# Patient Record
Sex: Female | Born: 1940 | Race: White | Hispanic: No | Smoking: Former smoker
Health system: Southern US, Community
[De-identification: ages and names within clinical notes are randomized; demographics above are authoritative.]

## PROBLEM LIST (undated history)

## (undated) DIAGNOSIS — E119 Type 2 diabetes mellitus without complications: Secondary | ICD-10-CM

## (undated) DIAGNOSIS — E538 Deficiency of other specified B group vitamins: Secondary | ICD-10-CM

## (undated) DIAGNOSIS — I10 Essential (primary) hypertension: Secondary | ICD-10-CM

## (undated) DIAGNOSIS — E079 Disorder of thyroid, unspecified: Secondary | ICD-10-CM

## (undated) DIAGNOSIS — E7800 Pure hypercholesterolemia, unspecified: Secondary | ICD-10-CM

## (undated) DIAGNOSIS — F0390 Unspecified dementia without behavioral disturbance: Secondary | ICD-10-CM

## (undated) DIAGNOSIS — K5900 Constipation, unspecified: Secondary | ICD-10-CM

## (undated) HISTORY — PX: ABDOMINAL HYSTERECTOMY: SHX81

---

## 2006-10-04 DIAGNOSIS — K219 Gastro-esophageal reflux disease without esophagitis: Secondary | ICD-10-CM | POA: Diagnosis present

## 2010-04-27 DIAGNOSIS — I10 Essential (primary) hypertension: Secondary | ICD-10-CM | POA: Diagnosis present

## 2010-06-17 DIAGNOSIS — E039 Hypothyroidism, unspecified: Secondary | ICD-10-CM | POA: Diagnosis present

## 2010-06-17 DIAGNOSIS — E119 Type 2 diabetes mellitus without complications: Secondary | ICD-10-CM

## 2020-04-20 DIAGNOSIS — I739 Peripheral vascular disease, unspecified: Secondary | ICD-10-CM | POA: Diagnosis not present

## 2020-04-20 DIAGNOSIS — Q845 Enlarged and hypertrophic nails: Secondary | ICD-10-CM | POA: Diagnosis not present

## 2020-04-21 DIAGNOSIS — E039 Hypothyroidism, unspecified: Secondary | ICD-10-CM | POA: Diagnosis not present

## 2020-04-21 DIAGNOSIS — E119 Type 2 diabetes mellitus without complications: Secondary | ICD-10-CM | POA: Diagnosis not present

## 2020-05-03 DIAGNOSIS — E785 Hyperlipidemia, unspecified: Secondary | ICD-10-CM | POA: Diagnosis not present

## 2020-05-03 DIAGNOSIS — Z634 Disappearance and death of family member: Secondary | ICD-10-CM | POA: Diagnosis not present

## 2020-05-03 DIAGNOSIS — E039 Hypothyroidism, unspecified: Secondary | ICD-10-CM | POA: Diagnosis not present

## 2020-05-03 DIAGNOSIS — Z79899 Other long term (current) drug therapy: Secondary | ICD-10-CM | POA: Diagnosis not present

## 2020-05-03 DIAGNOSIS — Z87891 Personal history of nicotine dependence: Secondary | ICD-10-CM | POA: Diagnosis not present

## 2020-05-03 DIAGNOSIS — E119 Type 2 diabetes mellitus without complications: Secondary | ICD-10-CM | POA: Diagnosis not present

## 2020-05-03 DIAGNOSIS — G301 Alzheimer's disease with late onset: Secondary | ICD-10-CM | POA: Diagnosis not present

## 2020-05-03 DIAGNOSIS — I10 Essential (primary) hypertension: Secondary | ICD-10-CM | POA: Diagnosis not present

## 2020-05-03 DIAGNOSIS — Z7984 Long term (current) use of oral hypoglycemic drugs: Secondary | ICD-10-CM | POA: Diagnosis not present

## 2020-05-03 DIAGNOSIS — R2689 Other abnormalities of gait and mobility: Secondary | ICD-10-CM | POA: Diagnosis not present

## 2020-05-11 DIAGNOSIS — R488 Other symbolic dysfunctions: Secondary | ICD-10-CM | POA: Diagnosis not present

## 2020-05-11 DIAGNOSIS — R2681 Unsteadiness on feet: Secondary | ICD-10-CM | POA: Diagnosis not present

## 2020-05-11 DIAGNOSIS — M6281 Muscle weakness (generalized): Secondary | ICD-10-CM | POA: Diagnosis not present

## 2020-05-11 DIAGNOSIS — R278 Other lack of coordination: Secondary | ICD-10-CM | POA: Diagnosis not present

## 2020-05-12 DIAGNOSIS — R278 Other lack of coordination: Secondary | ICD-10-CM | POA: Diagnosis not present

## 2020-05-12 DIAGNOSIS — R2681 Unsteadiness on feet: Secondary | ICD-10-CM | POA: Diagnosis not present

## 2020-05-12 DIAGNOSIS — M6281 Muscle weakness (generalized): Secondary | ICD-10-CM | POA: Diagnosis not present

## 2020-05-16 DIAGNOSIS — R2681 Unsteadiness on feet: Secondary | ICD-10-CM | POA: Diagnosis not present

## 2020-05-16 DIAGNOSIS — R278 Other lack of coordination: Secondary | ICD-10-CM | POA: Diagnosis not present

## 2020-05-16 DIAGNOSIS — M6281 Muscle weakness (generalized): Secondary | ICD-10-CM | POA: Diagnosis not present

## 2020-05-17 DIAGNOSIS — R488 Other symbolic dysfunctions: Secondary | ICD-10-CM | POA: Diagnosis not present

## 2020-05-17 DIAGNOSIS — R2681 Unsteadiness on feet: Secondary | ICD-10-CM | POA: Diagnosis not present

## 2020-05-18 DIAGNOSIS — G309 Alzheimer's disease, unspecified: Secondary | ICD-10-CM | POA: Diagnosis not present

## 2020-05-18 DIAGNOSIS — R278 Other lack of coordination: Secondary | ICD-10-CM | POA: Diagnosis not present

## 2020-05-18 DIAGNOSIS — E1169 Type 2 diabetes mellitus with other specified complication: Secondary | ICD-10-CM | POA: Diagnosis not present

## 2020-05-18 DIAGNOSIS — R2681 Unsteadiness on feet: Secondary | ICD-10-CM | POA: Diagnosis not present

## 2020-05-18 DIAGNOSIS — E785 Hyperlipidemia, unspecified: Secondary | ICD-10-CM | POA: Diagnosis not present

## 2020-05-18 DIAGNOSIS — R488 Other symbolic dysfunctions: Secondary | ICD-10-CM | POA: Diagnosis not present

## 2020-05-18 DIAGNOSIS — M6281 Muscle weakness (generalized): Secondary | ICD-10-CM | POA: Diagnosis not present

## 2020-05-23 DIAGNOSIS — R278 Other lack of coordination: Secondary | ICD-10-CM | POA: Diagnosis not present

## 2020-05-23 DIAGNOSIS — M6281 Muscle weakness (generalized): Secondary | ICD-10-CM | POA: Diagnosis not present

## 2020-05-23 DIAGNOSIS — R2681 Unsteadiness on feet: Secondary | ICD-10-CM | POA: Diagnosis not present

## 2020-05-24 DIAGNOSIS — R2681 Unsteadiness on feet: Secondary | ICD-10-CM | POA: Diagnosis not present

## 2020-05-24 DIAGNOSIS — R488 Other symbolic dysfunctions: Secondary | ICD-10-CM | POA: Diagnosis not present

## 2020-05-25 DIAGNOSIS — M6281 Muscle weakness (generalized): Secondary | ICD-10-CM | POA: Diagnosis not present

## 2020-05-25 DIAGNOSIS — R278 Other lack of coordination: Secondary | ICD-10-CM | POA: Diagnosis not present

## 2020-05-25 DIAGNOSIS — R488 Other symbolic dysfunctions: Secondary | ICD-10-CM | POA: Diagnosis not present

## 2020-05-25 DIAGNOSIS — R2681 Unsteadiness on feet: Secondary | ICD-10-CM | POA: Diagnosis not present

## 2020-05-27 DIAGNOSIS — M6281 Muscle weakness (generalized): Secondary | ICD-10-CM | POA: Diagnosis not present

## 2020-05-27 DIAGNOSIS — R278 Other lack of coordination: Secondary | ICD-10-CM | POA: Diagnosis not present

## 2020-05-27 DIAGNOSIS — R2681 Unsteadiness on feet: Secondary | ICD-10-CM | POA: Diagnosis not present

## 2020-05-30 DIAGNOSIS — M6281 Muscle weakness (generalized): Secondary | ICD-10-CM | POA: Diagnosis not present

## 2020-05-30 DIAGNOSIS — R2681 Unsteadiness on feet: Secondary | ICD-10-CM | POA: Diagnosis not present

## 2020-05-30 DIAGNOSIS — R278 Other lack of coordination: Secondary | ICD-10-CM | POA: Diagnosis not present

## 2020-05-31 DIAGNOSIS — M6281 Muscle weakness (generalized): Secondary | ICD-10-CM | POA: Diagnosis not present

## 2020-05-31 DIAGNOSIS — R2681 Unsteadiness on feet: Secondary | ICD-10-CM | POA: Diagnosis not present

## 2020-05-31 DIAGNOSIS — R278 Other lack of coordination: Secondary | ICD-10-CM | POA: Diagnosis not present

## 2020-06-02 DIAGNOSIS — M6281 Muscle weakness (generalized): Secondary | ICD-10-CM | POA: Diagnosis not present

## 2020-06-02 DIAGNOSIS — R278 Other lack of coordination: Secondary | ICD-10-CM | POA: Diagnosis not present

## 2020-06-02 DIAGNOSIS — R2681 Unsteadiness on feet: Secondary | ICD-10-CM | POA: Diagnosis not present

## 2020-06-03 DIAGNOSIS — R2681 Unsteadiness on feet: Secondary | ICD-10-CM | POA: Diagnosis not present

## 2020-06-03 DIAGNOSIS — R488 Other symbolic dysfunctions: Secondary | ICD-10-CM | POA: Diagnosis not present

## 2020-06-08 DIAGNOSIS — R488 Other symbolic dysfunctions: Secondary | ICD-10-CM | POA: Diagnosis not present

## 2020-06-08 DIAGNOSIS — R2681 Unsteadiness on feet: Secondary | ICD-10-CM | POA: Diagnosis not present

## 2020-06-09 DIAGNOSIS — R2681 Unsteadiness on feet: Secondary | ICD-10-CM | POA: Diagnosis not present

## 2020-06-09 DIAGNOSIS — R488 Other symbolic dysfunctions: Secondary | ICD-10-CM | POA: Diagnosis not present

## 2020-06-09 DIAGNOSIS — M6281 Muscle weakness (generalized): Secondary | ICD-10-CM | POA: Diagnosis not present

## 2020-06-09 DIAGNOSIS — R278 Other lack of coordination: Secondary | ICD-10-CM | POA: Diagnosis not present

## 2020-06-10 DIAGNOSIS — R278 Other lack of coordination: Secondary | ICD-10-CM | POA: Diagnosis not present

## 2020-06-10 DIAGNOSIS — M6281 Muscle weakness (generalized): Secondary | ICD-10-CM | POA: Diagnosis not present

## 2020-06-10 DIAGNOSIS — R2681 Unsteadiness on feet: Secondary | ICD-10-CM | POA: Diagnosis not present

## 2020-06-10 DIAGNOSIS — R488 Other symbolic dysfunctions: Secondary | ICD-10-CM | POA: Diagnosis not present

## 2020-06-14 DIAGNOSIS — M6281 Muscle weakness (generalized): Secondary | ICD-10-CM | POA: Diagnosis not present

## 2020-06-14 DIAGNOSIS — R488 Other symbolic dysfunctions: Secondary | ICD-10-CM | POA: Diagnosis not present

## 2020-06-14 DIAGNOSIS — R2681 Unsteadiness on feet: Secondary | ICD-10-CM | POA: Diagnosis not present

## 2020-06-14 DIAGNOSIS — R278 Other lack of coordination: Secondary | ICD-10-CM | POA: Diagnosis not present

## 2020-06-16 DIAGNOSIS — R278 Other lack of coordination: Secondary | ICD-10-CM | POA: Diagnosis not present

## 2020-06-16 DIAGNOSIS — M6281 Muscle weakness (generalized): Secondary | ICD-10-CM | POA: Diagnosis not present

## 2020-06-16 DIAGNOSIS — R2681 Unsteadiness on feet: Secondary | ICD-10-CM | POA: Diagnosis not present

## 2020-06-17 DIAGNOSIS — M6281 Muscle weakness (generalized): Secondary | ICD-10-CM | POA: Diagnosis not present

## 2020-06-17 DIAGNOSIS — R2681 Unsteadiness on feet: Secondary | ICD-10-CM | POA: Diagnosis not present

## 2020-06-17 DIAGNOSIS — R278 Other lack of coordination: Secondary | ICD-10-CM | POA: Diagnosis not present

## 2020-06-20 DIAGNOSIS — R2681 Unsteadiness on feet: Secondary | ICD-10-CM | POA: Diagnosis not present

## 2020-06-20 DIAGNOSIS — R278 Other lack of coordination: Secondary | ICD-10-CM | POA: Diagnosis not present

## 2020-06-20 DIAGNOSIS — M6281 Muscle weakness (generalized): Secondary | ICD-10-CM | POA: Diagnosis not present

## 2020-06-22 DIAGNOSIS — R2681 Unsteadiness on feet: Secondary | ICD-10-CM | POA: Diagnosis not present

## 2020-06-22 DIAGNOSIS — R278 Other lack of coordination: Secondary | ICD-10-CM | POA: Diagnosis not present

## 2020-06-22 DIAGNOSIS — M6281 Muscle weakness (generalized): Secondary | ICD-10-CM | POA: Diagnosis not present

## 2020-06-24 DIAGNOSIS — R278 Other lack of coordination: Secondary | ICD-10-CM | POA: Diagnosis not present

## 2020-06-24 DIAGNOSIS — M6281 Muscle weakness (generalized): Secondary | ICD-10-CM | POA: Diagnosis not present

## 2020-06-24 DIAGNOSIS — R2681 Unsteadiness on feet: Secondary | ICD-10-CM | POA: Diagnosis not present

## 2020-06-27 DIAGNOSIS — M6281 Muscle weakness (generalized): Secondary | ICD-10-CM | POA: Diagnosis not present

## 2020-06-27 DIAGNOSIS — R278 Other lack of coordination: Secondary | ICD-10-CM | POA: Diagnosis not present

## 2020-06-27 DIAGNOSIS — R2681 Unsteadiness on feet: Secondary | ICD-10-CM | POA: Diagnosis not present

## 2020-06-28 DIAGNOSIS — R2681 Unsteadiness on feet: Secondary | ICD-10-CM | POA: Diagnosis not present

## 2020-06-28 DIAGNOSIS — R278 Other lack of coordination: Secondary | ICD-10-CM | POA: Diagnosis not present

## 2020-06-28 DIAGNOSIS — M6281 Muscle weakness (generalized): Secondary | ICD-10-CM | POA: Diagnosis not present

## 2020-06-30 DIAGNOSIS — R278 Other lack of coordination: Secondary | ICD-10-CM | POA: Diagnosis not present

## 2020-06-30 DIAGNOSIS — R2681 Unsteadiness on feet: Secondary | ICD-10-CM | POA: Diagnosis not present

## 2020-06-30 DIAGNOSIS — M6281 Muscle weakness (generalized): Secondary | ICD-10-CM | POA: Diagnosis not present

## 2020-07-06 DIAGNOSIS — E785 Hyperlipidemia, unspecified: Secondary | ICD-10-CM | POA: Diagnosis not present

## 2020-07-06 DIAGNOSIS — G309 Alzheimer's disease, unspecified: Secondary | ICD-10-CM | POA: Diagnosis not present

## 2020-07-06 DIAGNOSIS — E1169 Type 2 diabetes mellitus with other specified complication: Secondary | ICD-10-CM | POA: Diagnosis not present

## 2020-08-04 DIAGNOSIS — E039 Hypothyroidism, unspecified: Secondary | ICD-10-CM | POA: Diagnosis not present

## 2020-08-04 DIAGNOSIS — I10 Essential (primary) hypertension: Secondary | ICD-10-CM | POA: Diagnosis not present

## 2020-08-04 DIAGNOSIS — E119 Type 2 diabetes mellitus without complications: Secondary | ICD-10-CM | POA: Diagnosis not present

## 2020-08-04 DIAGNOSIS — E785 Hyperlipidemia, unspecified: Secondary | ICD-10-CM | POA: Diagnosis not present

## 2020-09-14 DIAGNOSIS — G309 Alzheimer's disease, unspecified: Secondary | ICD-10-CM | POA: Diagnosis not present

## 2020-09-14 DIAGNOSIS — E039 Hypothyroidism, unspecified: Secondary | ICD-10-CM | POA: Diagnosis not present

## 2020-10-19 DIAGNOSIS — E1159 Type 2 diabetes mellitus with other circulatory complications: Secondary | ICD-10-CM | POA: Diagnosis not present

## 2020-10-19 DIAGNOSIS — I152 Hypertension secondary to endocrine disorders: Secondary | ICD-10-CM | POA: Diagnosis not present

## 2020-10-19 DIAGNOSIS — G309 Alzheimer's disease, unspecified: Secondary | ICD-10-CM | POA: Diagnosis not present

## 2020-11-01 DIAGNOSIS — G309 Alzheimer's disease, unspecified: Secondary | ICD-10-CM | POA: Diagnosis not present

## 2020-11-01 DIAGNOSIS — Z79899 Other long term (current) drug therapy: Secondary | ICD-10-CM | POA: Diagnosis not present

## 2020-11-01 DIAGNOSIS — G301 Alzheimer's disease with late onset: Secondary | ICD-10-CM | POA: Diagnosis not present

## 2020-11-16 DIAGNOSIS — E119 Type 2 diabetes mellitus without complications: Secondary | ICD-10-CM | POA: Diagnosis not present

## 2020-11-16 DIAGNOSIS — G309 Alzheimer's disease, unspecified: Secondary | ICD-10-CM | POA: Diagnosis not present

## 2020-11-16 DIAGNOSIS — I10 Essential (primary) hypertension: Secondary | ICD-10-CM | POA: Diagnosis not present

## 2020-11-16 DIAGNOSIS — E039 Hypothyroidism, unspecified: Secondary | ICD-10-CM | POA: Diagnosis not present

## 2020-11-16 DIAGNOSIS — E785 Hyperlipidemia, unspecified: Secondary | ICD-10-CM | POA: Diagnosis not present

## 2020-11-28 DIAGNOSIS — E119 Type 2 diabetes mellitus without complications: Secondary | ICD-10-CM | POA: Diagnosis not present

## 2020-11-28 DIAGNOSIS — E785 Hyperlipidemia, unspecified: Secondary | ICD-10-CM | POA: Diagnosis not present

## 2020-11-28 DIAGNOSIS — G301 Alzheimer's disease with late onset: Secondary | ICD-10-CM | POA: Diagnosis not present

## 2020-11-28 DIAGNOSIS — Z7984 Long term (current) use of oral hypoglycemic drugs: Secondary | ICD-10-CM | POA: Diagnosis not present

## 2020-11-28 DIAGNOSIS — Z87891 Personal history of nicotine dependence: Secondary | ICD-10-CM | POA: Diagnosis not present

## 2020-11-28 DIAGNOSIS — E039 Hypothyroidism, unspecified: Secondary | ICD-10-CM | POA: Diagnosis not present

## 2020-11-28 DIAGNOSIS — I10 Essential (primary) hypertension: Secondary | ICD-10-CM | POA: Diagnosis not present

## 2020-11-28 DIAGNOSIS — F32A Depression, unspecified: Secondary | ICD-10-CM | POA: Diagnosis not present

## 2020-11-28 DIAGNOSIS — Z79899 Other long term (current) drug therapy: Secondary | ICD-10-CM | POA: Diagnosis not present

## 2020-12-28 DIAGNOSIS — G309 Alzheimer's disease, unspecified: Secondary | ICD-10-CM | POA: Diagnosis not present

## 2020-12-28 DIAGNOSIS — I10 Essential (primary) hypertension: Secondary | ICD-10-CM | POA: Diagnosis not present

## 2021-01-11 DIAGNOSIS — G309 Alzheimer's disease, unspecified: Secondary | ICD-10-CM | POA: Diagnosis not present

## 2021-01-11 DIAGNOSIS — E039 Hypothyroidism, unspecified: Secondary | ICD-10-CM | POA: Diagnosis not present

## 2021-01-11 DIAGNOSIS — E785 Hyperlipidemia, unspecified: Secondary | ICD-10-CM | POA: Diagnosis not present

## 2021-01-11 DIAGNOSIS — E119 Type 2 diabetes mellitus without complications: Secondary | ICD-10-CM | POA: Diagnosis not present

## 2021-01-11 DIAGNOSIS — I10 Essential (primary) hypertension: Secondary | ICD-10-CM | POA: Diagnosis not present

## 2021-01-23 DIAGNOSIS — E1159 Type 2 diabetes mellitus with other circulatory complications: Secondary | ICD-10-CM | POA: Diagnosis not present

## 2021-01-23 DIAGNOSIS — I7091 Generalized atherosclerosis: Secondary | ICD-10-CM | POA: Diagnosis not present

## 2021-01-23 DIAGNOSIS — B351 Tinea unguium: Secondary | ICD-10-CM | POA: Diagnosis not present

## 2021-01-23 DIAGNOSIS — R600 Localized edema: Secondary | ICD-10-CM | POA: Diagnosis not present

## 2021-07-28 DIAGNOSIS — E119 Type 2 diabetes mellitus without complications: Secondary | ICD-10-CM | POA: Diagnosis not present

## 2021-07-28 DIAGNOSIS — I10 Essential (primary) hypertension: Secondary | ICD-10-CM | POA: Diagnosis not present

## 2021-07-28 DIAGNOSIS — E785 Hyperlipidemia, unspecified: Secondary | ICD-10-CM | POA: Diagnosis not present

## 2021-07-28 DIAGNOSIS — E039 Hypothyroidism, unspecified: Secondary | ICD-10-CM | POA: Diagnosis not present

## 2021-08-09 DIAGNOSIS — E038 Other specified hypothyroidism: Secondary | ICD-10-CM | POA: Diagnosis not present

## 2021-08-09 DIAGNOSIS — R197 Diarrhea, unspecified: Secondary | ICD-10-CM | POA: Diagnosis not present

## 2021-08-09 DIAGNOSIS — E782 Mixed hyperlipidemia: Secondary | ICD-10-CM | POA: Diagnosis not present

## 2021-08-15 ENCOUNTER — Inpatient Hospital Stay (HOSPITAL_BASED_OUTPATIENT_CLINIC_OR_DEPARTMENT_OTHER)
Admission: EM | Admit: 2021-08-15 | Discharge: 2021-08-22 | DRG: 522 | Disposition: A | Discharge status: Skilled Nursing Facility | Payer: Medicare Other | Source: Skilled Nursing Facility | Attending: Internal Medicine | Admitting: Internal Medicine

## 2021-08-15 ENCOUNTER — Other Ambulatory Visit: Payer: Self-pay

## 2021-08-15 ENCOUNTER — Encounter (HOSPITAL_BASED_OUTPATIENT_CLINIC_OR_DEPARTMENT_OTHER): Payer: Self-pay | Admitting: Obstetrics and Gynecology

## 2021-08-15 ENCOUNTER — Emergency Department (HOSPITAL_BASED_OUTPATIENT_CLINIC_OR_DEPARTMENT_OTHER): Payer: Medicare Other

## 2021-08-15 ENCOUNTER — Emergency Department (HOSPITAL_BASED_OUTPATIENT_CLINIC_OR_DEPARTMENT_OTHER): Payer: Medicare Other | Admitting: Radiology

## 2021-08-15 DIAGNOSIS — E119 Type 2 diabetes mellitus without complications: Secondary | ICD-10-CM | POA: Diagnosis not present

## 2021-08-15 DIAGNOSIS — F03918 Unspecified dementia, unspecified severity, with other behavioral disturbance: Secondary | ICD-10-CM | POA: Diagnosis not present

## 2021-08-15 DIAGNOSIS — Z20822 Contact with and (suspected) exposure to covid-19: Secondary | ICD-10-CM | POA: Diagnosis present

## 2021-08-15 DIAGNOSIS — Z87891 Personal history of nicotine dependence: Secondary | ICD-10-CM

## 2021-08-15 DIAGNOSIS — S72012A Unspecified intracapsular fracture of left femur, initial encounter for closed fracture: Secondary | ICD-10-CM | POA: Diagnosis not present

## 2021-08-15 DIAGNOSIS — K5900 Constipation, unspecified: Secondary | ICD-10-CM | POA: Diagnosis not present

## 2021-08-15 DIAGNOSIS — S72002A Fracture of unspecified part of neck of left femur, initial encounter for closed fracture: Principal | ICD-10-CM | POA: Diagnosis present

## 2021-08-15 DIAGNOSIS — F02C18 Dementia in other diseases classified elsewhere, severe, with other behavioral disturbance: Secondary | ICD-10-CM | POA: Diagnosis present

## 2021-08-15 DIAGNOSIS — D62 Acute posthemorrhagic anemia: Secondary | ICD-10-CM

## 2021-08-15 DIAGNOSIS — Z79890 Hormone replacement therapy (postmenopausal): Secondary | ICD-10-CM

## 2021-08-15 DIAGNOSIS — I10 Essential (primary) hypertension: Secondary | ICD-10-CM | POA: Diagnosis present

## 2021-08-15 DIAGNOSIS — Z043 Encounter for examination and observation following other accident: Secondary | ICD-10-CM | POA: Diagnosis not present

## 2021-08-15 DIAGNOSIS — Z96642 Presence of left artificial hip joint: Secondary | ICD-10-CM | POA: Diagnosis not present

## 2021-08-15 DIAGNOSIS — Z7984 Long term (current) use of oral hypoglycemic drugs: Secondary | ICD-10-CM | POA: Diagnosis not present

## 2021-08-15 DIAGNOSIS — E538 Deficiency of other specified B group vitamins: Secondary | ICD-10-CM | POA: Diagnosis present

## 2021-08-15 DIAGNOSIS — E1169 Type 2 diabetes mellitus with other specified complication: Secondary | ICD-10-CM

## 2021-08-15 DIAGNOSIS — G309 Alzheimer's disease, unspecified: Secondary | ICD-10-CM | POA: Diagnosis not present

## 2021-08-15 DIAGNOSIS — R6889 Other general symptoms and signs: Secondary | ICD-10-CM | POA: Diagnosis not present

## 2021-08-15 DIAGNOSIS — E7800 Pure hypercholesterolemia, unspecified: Secondary | ICD-10-CM | POA: Diagnosis present

## 2021-08-15 DIAGNOSIS — E039 Hypothyroidism, unspecified: Secondary | ICD-10-CM | POA: Diagnosis present

## 2021-08-15 DIAGNOSIS — Z79899 Other long term (current) drug therapy: Secondary | ICD-10-CM

## 2021-08-15 DIAGNOSIS — M25552 Pain in left hip: Secondary | ICD-10-CM | POA: Diagnosis not present

## 2021-08-15 DIAGNOSIS — R9431 Abnormal electrocardiogram [ECG] [EKG]: Secondary | ICD-10-CM | POA: Diagnosis not present

## 2021-08-15 DIAGNOSIS — W19XXXA Unspecified fall, initial encounter: Secondary | ICD-10-CM | POA: Diagnosis not present

## 2021-08-15 DIAGNOSIS — W1830XA Fall on same level, unspecified, initial encounter: Secondary | ICD-10-CM | POA: Diagnosis present

## 2021-08-15 DIAGNOSIS — Z66 Do not resuscitate: Secondary | ICD-10-CM | POA: Diagnosis not present

## 2021-08-15 DIAGNOSIS — F0390 Unspecified dementia without behavioral disturbance: Secondary | ICD-10-CM | POA: Insufficient documentation

## 2021-08-15 DIAGNOSIS — Z743 Need for continuous supervision: Secondary | ICD-10-CM | POA: Diagnosis not present

## 2021-08-15 DIAGNOSIS — Z90710 Acquired absence of both cervix and uterus: Secondary | ICD-10-CM

## 2021-08-15 HISTORY — DX: Constipation, unspecified: K59.00

## 2021-08-15 HISTORY — DX: Disorder of thyroid, unspecified: E07.9

## 2021-08-15 HISTORY — DX: Pure hypercholesterolemia, unspecified: E78.00

## 2021-08-15 HISTORY — DX: Deficiency of other specified B group vitamins: E53.8

## 2021-08-15 HISTORY — DX: Type 2 diabetes mellitus without complications: E11.9

## 2021-08-15 HISTORY — DX: Essential (primary) hypertension: I10

## 2021-08-15 HISTORY — DX: Unspecified dementia, unspecified severity, without behavioral disturbance, psychotic disturbance, mood disturbance, and anxiety: F03.90

## 2021-08-15 LAB — CBC WITH DIFFERENTIAL/PLATELET
Abs Immature Granulocytes: 0.04 10*3/uL (ref 0.00–0.07)
Basophils Absolute: 0 10*3/uL (ref 0.0–0.1)
Basophils Relative: 0 %
Eosinophils Absolute: 0.1 10*3/uL (ref 0.0–0.5)
Eosinophils Relative: 1 %
HCT: 36.6 % (ref 36.0–46.0)
Hemoglobin: 12.4 g/dL (ref 12.0–15.0)
Immature Granulocytes: 0 %
Lymphocytes Relative: 7 %
Lymphs Abs: 0.6 10*3/uL — ABNORMAL LOW (ref 0.7–4.0)
MCH: 31.2 pg (ref 26.0–34.0)
MCHC: 33.9 g/dL (ref 30.0–36.0)
MCV: 92 fL (ref 80.0–100.0)
Monocytes Absolute: 0.9 10*3/uL (ref 0.1–1.0)
Monocytes Relative: 10 %
Neutro Abs: 7.4 10*3/uL (ref 1.7–7.7)
Neutrophils Relative %: 82 %
Platelets: 203 10*3/uL (ref 150–400)
RBC: 3.98 MIL/uL (ref 3.87–5.11)
RDW: 13.2 % (ref 11.5–15.5)
WBC: 9.1 10*3/uL (ref 4.0–10.5)
nRBC: 0 % (ref 0.0–0.2)

## 2021-08-15 LAB — BASIC METABOLIC PANEL
Anion gap: 15 (ref 5–15)
BUN: 20 mg/dL (ref 8–23)
CO2: 24 mmol/L (ref 22–32)
Calcium: 9.7 mg/dL (ref 8.9–10.3)
Chloride: 95 mmol/L — ABNORMAL LOW (ref 98–111)
Creatinine, Ser: 0.92 mg/dL (ref 0.44–1.00)
GFR, Estimated: >60 mL/min (ref >60)
Glucose, Bld: 120 mg/dL — ABNORMAL HIGH (ref 70–99)
Potassium: 4.5 mmol/L (ref 3.5–5.1)
Sodium: 134 mmol/L — ABNORMAL LOW (ref 135–145)

## 2021-08-15 MED ORDER — SODIUM CHLORIDE 0.9 % IV SOLN: Freq: Once | INTRAVENOUS | Status: AC

## 2021-08-15 MED ORDER — ACETAMINOPHEN 325 MG PO TABS: 650.0000 mg | ORAL_TABLET | Freq: Four times a day (QID) | ORAL | Status: DC | PRN

## 2021-08-15 NOTE — ED Triage Notes (Signed)
Patient presents to the ER for fall at [facility]. Patient is BIB EMS. Patient reports left hip pain. Patient is not alert or oriented. Patient has no shortening or rotation per EMS. No blood thinners. Patient's fall was unwitnessed. Had a dental procedure completed today under anesthesia.

## 2021-08-15 NOTE — Progress Notes (Signed)
Patient discussed with EDP.  Imaging reviewed.  There is a left femoral neck fracture which appears more displaced than purely valgus impacted.  Per EDP patient ambulatory without assistive devices prior to fall which resulted in this injury.  Patient not on any anticoagulation.    Recommendations: - NWB LLE - Transfer to [facility] - Admit to medical service for preoperative clearance/risk stratification/optimization - NPO after midnight for possible OR 08/16/2021 - Full consult to follow in AM  Ernestina Columbia M.D. Orthopaedic Surgery [facility]

## 2021-08-15 NOTE — ED Provider Notes (Signed)
I provided a substantive portion of the care of this patient.  I personally performed the entirety of the exam for this encounter.     Patient had been sedated for a dental procedure.  She was back home and seated in the chair and unexpectedly got up to go to the bathroom.  The patient fell landing on her left hip.  Patient does have significant dementia at baseline and had sedation for her procedure.  He has been awake but confused.  Patient is resting in the stretcher with some picking motion of the hands.  With light voice she awakens and is pleasant and interactive.  She is confused.  Bilateral breath sounds symmetric.  No pain to compression of the chest wall no crepitus.  No pain around the shoulders.  She is holding the left hip in a flexed position.  Both feet are warm and dry.  1+ pulse on the left foot 2+ pulse right foot.   Arby Barrette, MD 08/15/21 [phone]

## 2021-08-15 NOTE — ED Provider Notes (Signed)
[facility] EMERGENCY DEPT Provider Note   CSN: [id number] Arrival date & time: 08/15/21  1717     History  Chief Complaint  Patient presents with   Marletta Lor    Beth Pennington is a 81 y.o. female with history of Alzheimer's disease currently at [facility] who presents to the ED for evaluation of an unwitnessed ground-level fall that occurred earlier today.  Patient is accompanied by her son and daughter-in-law who provide most of the history.  Per son, patient had a dental procedure done today with anesthesia that was performed without complication.  She was reportedly taken back to [facility] and put to bed, but at some point, she woke up and t decided to go to the bathroom.  She was found down in the bathroom later that day, and staff is unsure for how long that she had been down after the fall before being found.  Patient states she has pain in her hip.  History otherwise difficult due to degree of Alzheimer's.  Family states that she is not on blood thinners.   Fall      Home Medications Prior to Admission medications   Not on File      Allergies    Patient has no known allergies.    Review of Systems   Review of Systems  Physical Exam Updated Vital Signs BP (!) 157/107   Pulse 95   Temp 98.4 F (36.9 C) (Oral)   Resp 18   SpO2 98%  Physical Exam Vitals and nursing note reviewed.  Constitutional:      General: She is not in acute distress.    Appearance: She is not ill-appearing.     Comments: Patient lying flat on her back, with knees up and feet flat on the ground.  Family notes this is only position comfortable for her  HENT:     Head: Atraumatic.  Eyes:     Conjunctiva/sclera: Conjunctivae normal.  Cardiovascular:     Rate and Rhythm: Normal rate and regular rhythm.     Pulses: Normal pulses.          Radial pulses are 2+ on the right side and 2+ on the left side.       Dorsalis pedis pulses are 2+ on the right side and 2+ on the left  side.     Heart sounds: No murmur heard. Pulmonary:     Effort: Pulmonary effort is normal. No respiratory distress.     Breath sounds: Normal breath sounds.  Abdominal:     General: Abdomen is flat. There is no distension.     Palpations: Abdomen is soft.     Tenderness: There is no abdominal tenderness.  Musculoskeletal:        General: Normal range of motion.     Cervical back: Normal range of motion.     Right lower leg: No edema.     Left lower leg: No edema.     Comments: No obvious rotation of the limb.  Mild tenderness near the proximal femoral head.  No obvious bruising, swelling or deformity.  Skin:    General: Skin is warm and dry.     Capillary Refill: Capillary refill takes less than 2 seconds.  Neurological:     General: No focal deficit present.     Mental Status: She is alert.  Psychiatric:        Mood and Affect: Mood normal.    ED Results / Procedures / Treatments  Labs (all labs ordered are listed, but only abnormal results are displayed) Labs Reviewed  BASIC METABOLIC PANEL - Abnormal; Notable for the following components:      Result Value   Sodium 134 (*)    Chloride 95 (*)    Glucose, Bld 120 (*)    All other components within normal limits  CBC WITH DIFFERENTIAL/PLATELET - Abnormal; Notable for the following components:   Lymphs Abs 0.6 (*)    All other components within normal limits    EKG None  Radiology DG Chest Portable 1 View  Result Date: 08/15/2021 CLINICAL DATA:  fall EXAM: PORTABLE CHEST 1 VIEW.  Patient is rotated. COMPARISON:  None Available. FINDINGS: The heart and mediastinal contours are within normal limits. No focal consolidation. No pulmonary edema. No pleural effusion. No pneumothorax. No acute osseous abnormality. IMPRESSION: No active disease. Electronically Signed   By: Tish FredericksonMorgane  Naveau M.D.   On: 08/15/2021 20:39   DG Hip Unilat W or Wo Pelvis 2-3 Views Left  Result Date: 08/15/2021 CLINICAL DATA:  Trauma, fall EXAM: DG  HIP (WITH OR WITHOUT PELVIS) 2-3V LEFT COMPARISON:  None Available. FINDINGS: There is comminuted subcapital fracture of neck of left femur. There is no dislocation. Osteopenia is seen in bony structures. IMPRESSION: There is comminuted, impacted fracture in the subcapital portion of neck of left femur. Electronically Signed   By: Ernie AvenaPalani  Rathinasamy M.D.   On: 08/15/2021 17:47    Procedures Procedures    Medications Ordered in ED Medications  0.9 %  sodium chloride infusion (has no administration in time range)  acetaminophen (TYLENOL) tablet 650 mg (has no administration in time range)    ED Course/ Medical Decision Making/ A&P                           Medical Decision Making Amount and/or Complexity of Data Reviewed Labs: ordered. Radiology: ordered.  Risk Decision regarding hospitalization.   Social determinants of health:  Social History   Socioeconomic History   Marital status: Widowed    Spouse name: Not on file   Number of children: Not on file   Years of education: Not on file   Highest education level: Not on file  Occupational History   Not on file  Tobacco Use   Smoking status: Former    Types: Cigarettes    Passive exposure: Past   Smokeless tobacco: Never  Vaping Use   Vaping Use: Never used  Substance and Sexual Activity   Alcohol use: Not Currently   Drug use: Not Currently   Sexual activity: Not Currently  Other Topics Concern   Not on file  Social History Narrative   Not on file   Social Determinants of Health   Financial Resource Strain: Not on file  Food Insecurity: Not on file  Transportation Needs: Not on file  Physical Activity: Not on file  Stress: Not on file  Social Connections: Not on file  Intimate Partner Violence: Not on file     Initial impression:  This patient presents to the ED for concern after a ground-level fall that occurred earlier today, this involves an extensive number of treatment options, and is a complaint  that carries with it a high risk of complications and morbidity.   Differentials include fracture, dislocation, compression fracture.   Comorbidities affecting care:  Alzheimer's  Additional history obtained: Son and daughter-in-law  Lab Tests  I Ordered, reviewed, and interpreted labs and EKG.  The pertinent results include:  BMP and CBC unremarkable  Imaging Studies ordered:  I ordered imaging studies including  Chest x-ray without acute findings Left hip x-ray with comminuted impacted fracture of the left femoral neck I independently visualized and interpreted imaging and I agree with the radiologist interpretation.   EKG: Sinus rhythm  Cardiac Monitoring:  The patient was maintained on a cardiac monitor.  I personally viewed and interpreted the cardiac monitored which showed an underlying rhythm of: Sinus rhythm   Medicines ordered and prescription drug management:  I ordered medication including: Normal saline maintenance at 100 mils per hour Tylenol 650 mg p.o. I have reviewed the patients home medicines and have made adjustments as needed   Consultations Obtained:  I requested consultation with orthopedic surgery spoke with Dr. Sherilyn Dacosta,  and discussed lab and imaging findings as well as pertinent plan - they recommend: Admit to medicine while he does surgical planning for surgery likely tomorrow   ED Course/Re-evaluation: 81 year old female presents to the ED for evaluation of ground-level fall that occurred earlier today.  Vitals without significant abnormality.  On exam, lying flat on her back with her hips flexed.  Neurovascularly intact.  No obvious external rotation of the hips.  Physical exam without any signs of additional injuries.  X-ray shows comminuted impacted fracture of the left femoral neck,.  Patient's family believe that patient seems slightly altered residually after her dental procedure today.  Patient herself does not appear to be in significant  pain so with hold narcotic medications in favor of Tylenol for now.  We will also administer maintenance fluids at 100 mils per hour normal saline.  Consulted with surgery and spoke with Dr. Sherilyn Dacosta who will plan patient surgery.  Dr. Antionette Char agrees to admit to medicine. My attending, Dr. Clarice Pole also personally evaluated the patient and agrees with treatment and plan.  Disposition:  After consideration of the diagnostic results, physical exam, history and the patients response to treatment feel that the patent would benefit from admission.   Left femoral neck fracture: Plan and management as described above. Discharged home in good condition.  Final Clinical Impression(s) / ED Diagnoses Final diagnoses:  Closed fracture of neck of left femur, initial encounter [facility])    Rx / DC Orders ED Discharge Orders     None         Delight Ovens 08/15/21 2152    Arby Barrette, MD 08/19/21 2321

## 2021-08-16 ENCOUNTER — Emergency Department (HOSPITAL_BASED_OUTPATIENT_CLINIC_OR_DEPARTMENT_OTHER): Payer: Medicare Other

## 2021-08-16 DIAGNOSIS — Z79890 Hormone replacement therapy (postmenopausal): Secondary | ICD-10-CM | POA: Diagnosis not present

## 2021-08-16 DIAGNOSIS — Z20822 Contact with and (suspected) exposure to covid-19: Secondary | ICD-10-CM | POA: Diagnosis present

## 2021-08-16 DIAGNOSIS — S72002A Fracture of unspecified part of neck of left femur, initial encounter for closed fracture: Secondary | ICD-10-CM | POA: Diagnosis not present

## 2021-08-16 DIAGNOSIS — Z87891 Personal history of nicotine dependence: Secondary | ICD-10-CM | POA: Diagnosis not present

## 2021-08-16 DIAGNOSIS — F0390 Unspecified dementia without behavioral disturbance: Secondary | ICD-10-CM | POA: Insufficient documentation

## 2021-08-16 DIAGNOSIS — W1830XA Fall on same level, unspecified, initial encounter: Secondary | ICD-10-CM | POA: Diagnosis present

## 2021-08-16 DIAGNOSIS — E119 Type 2 diabetes mellitus without complications: Secondary | ICD-10-CM

## 2021-08-16 DIAGNOSIS — K5900 Constipation, unspecified: Secondary | ICD-10-CM | POA: Diagnosis not present

## 2021-08-16 DIAGNOSIS — F02C18 Dementia in other diseases classified elsewhere, severe, with other behavioral disturbance: Secondary | ICD-10-CM | POA: Diagnosis present

## 2021-08-16 DIAGNOSIS — M6281 Muscle weakness (generalized): Secondary | ICD-10-CM | POA: Diagnosis not present

## 2021-08-16 DIAGNOSIS — M2550 Pain in unspecified joint: Secondary | ICD-10-CM | POA: Diagnosis not present

## 2021-08-16 DIAGNOSIS — G309 Alzheimer's disease, unspecified: Secondary | ICD-10-CM | POA: Diagnosis present

## 2021-08-16 DIAGNOSIS — E039 Hypothyroidism, unspecified: Secondary | ICD-10-CM | POA: Diagnosis present

## 2021-08-16 DIAGNOSIS — F03918 Unspecified dementia, unspecified severity, with other behavioral disturbance: Secondary | ICD-10-CM

## 2021-08-16 DIAGNOSIS — D62 Acute posthemorrhagic anemia: Secondary | ICD-10-CM | POA: Diagnosis not present

## 2021-08-16 DIAGNOSIS — R2689 Other abnormalities of gait and mobility: Secondary | ICD-10-CM | POA: Diagnosis not present

## 2021-08-16 DIAGNOSIS — E1169 Type 2 diabetes mellitus with other specified complication: Secondary | ICD-10-CM | POA: Diagnosis not present

## 2021-08-16 DIAGNOSIS — R5381 Other malaise: Secondary | ICD-10-CM | POA: Diagnosis not present

## 2021-08-16 DIAGNOSIS — Z471 Aftercare following joint replacement surgery: Secondary | ICD-10-CM | POA: Diagnosis not present

## 2021-08-16 DIAGNOSIS — Z0389 Encounter for observation for other suspected diseases and conditions ruled out: Secondary | ICD-10-CM | POA: Diagnosis not present

## 2021-08-16 DIAGNOSIS — E538 Deficiency of other specified B group vitamins: Secondary | ICD-10-CM | POA: Diagnosis present

## 2021-08-16 DIAGNOSIS — S72002D Fracture of unspecified part of neck of left femur, subsequent encounter for closed fracture with routine healing: Secondary | ICD-10-CM | POA: Diagnosis not present

## 2021-08-16 DIAGNOSIS — E7800 Pure hypercholesterolemia, unspecified: Secondary | ICD-10-CM | POA: Diagnosis present

## 2021-08-16 DIAGNOSIS — Z7984 Long term (current) use of oral hypoglycemic drugs: Secondary | ICD-10-CM | POA: Diagnosis not present

## 2021-08-16 DIAGNOSIS — W19XXXA Unspecified fall, initial encounter: Secondary | ICD-10-CM | POA: Diagnosis not present

## 2021-08-16 DIAGNOSIS — M25552 Pain in left hip: Secondary | ICD-10-CM | POA: Diagnosis not present

## 2021-08-16 DIAGNOSIS — Z96642 Presence of left artificial hip joint: Secondary | ICD-10-CM | POA: Diagnosis not present

## 2021-08-16 DIAGNOSIS — I69828 Other speech and language deficits following other cerebrovascular disease: Secondary | ICD-10-CM | POA: Diagnosis not present

## 2021-08-16 DIAGNOSIS — Z66 Do not resuscitate: Secondary | ICD-10-CM | POA: Diagnosis present

## 2021-08-16 DIAGNOSIS — Z90710 Acquired absence of both cervix and uterus: Secondary | ICD-10-CM | POA: Diagnosis not present

## 2021-08-16 DIAGNOSIS — Z9181 History of falling: Secondary | ICD-10-CM | POA: Diagnosis not present

## 2021-08-16 DIAGNOSIS — G301 Alzheimer's disease with late onset: Secondary | ICD-10-CM | POA: Diagnosis not present

## 2021-08-16 DIAGNOSIS — Z79899 Other long term (current) drug therapy: Secondary | ICD-10-CM | POA: Diagnosis not present

## 2021-08-16 DIAGNOSIS — I10 Essential (primary) hypertension: Secondary | ICD-10-CM | POA: Diagnosis not present

## 2021-08-16 DIAGNOSIS — Z7401 Bed confinement status: Secondary | ICD-10-CM | POA: Diagnosis not present

## 2021-08-16 DIAGNOSIS — R2681 Unsteadiness on feet: Secondary | ICD-10-CM | POA: Diagnosis not present

## 2021-08-16 LAB — CBC WITH DIFFERENTIAL/PLATELET
Abs Immature Granulocytes: 0.03 10*3/uL (ref 0.00–0.07)
Basophils Absolute: 0.1 10*3/uL (ref 0.0–0.1)
Basophils Relative: 1 %
Eosinophils Absolute: 0.6 10*3/uL — ABNORMAL HIGH (ref 0.0–0.5)
Eosinophils Relative: 8 %
HCT: 36.3 % (ref 36.0–46.0)
Hemoglobin: 12.2 g/dL (ref 12.0–15.0)
Immature Granulocytes: 0 %
Lymphocytes Relative: 16 %
Lymphs Abs: 1.1 10*3/uL (ref 0.7–4.0)
MCH: 32 pg (ref 26.0–34.0)
MCHC: 33.6 g/dL (ref 30.0–36.0)
MCV: 95.3 fL (ref 80.0–100.0)
Monocytes Absolute: 1.1 10*3/uL — ABNORMAL HIGH (ref 0.1–1.0)
Monocytes Relative: 15 %
Neutro Abs: 4.4 10*3/uL (ref 1.7–7.7)
Neutrophils Relative %: 60 %
Platelets: 193 10*3/uL (ref 150–400)
RBC: 3.81 MIL/uL — ABNORMAL LOW (ref 3.87–5.11)
RDW: 13.6 % (ref 11.5–15.5)
WBC: 7.3 10*3/uL (ref 4.0–10.5)
nRBC: 0 % (ref 0.0–0.2)

## 2021-08-16 LAB — BASIC METABOLIC PANEL
Anion gap: 8 (ref 5–15)
BUN: 16 mg/dL (ref 8–23)
CO2: 28 mmol/L (ref 22–32)
Calcium: 9.2 mg/dL (ref 8.9–10.3)
Chloride: 103 mmol/L (ref 98–111)
Creatinine, Ser: 1 mg/dL (ref 0.44–1.00)
GFR, Estimated: 57 mL/min — ABNORMAL LOW (ref >60)
Glucose, Bld: 113 mg/dL — ABNORMAL HIGH (ref 70–99)
Potassium: 4.4 mmol/L (ref 3.5–5.1)
Sodium: 139 mmol/L (ref 135–145)

## 2021-08-16 LAB — HEMOGLOBIN A1C
Hgb A1c MFr Bld: 6 % — ABNORMAL HIGH (ref 4.8–5.6)
Mean Plasma Glucose: 125.5 mg/dL

## 2021-08-16 LAB — SURGICAL PCR SCREEN
MRSA, PCR: NEGATIVE
Staphylococcus aureus: NEGATIVE

## 2021-08-16 LAB — CBG MONITORING, ED
Glucose-Capillary: 75 mg/dL (ref 70–99)
Glucose-Capillary: 99 mg/dL (ref 70–99)

## 2021-08-16 MED ORDER — CEFAZOLIN SODIUM-DEXTROSE 2-4 GM/100ML-% IV SOLN
2.0000 g | INTRAVENOUS | Status: AC
  Administered 2021-08-17: 2 g via INTRAVENOUS
  Filled 2021-08-16: qty 100

## 2021-08-16 MED ORDER — FLUOXETINE HCL 20 MG/5ML PO SOLN
5.0000 mg | Freq: Every day | ORAL | Status: DC
  Administered 2021-08-16 – 2021-08-22 (×6): 5 mg via ORAL
  Filled 2021-08-16 (×12): qty 5

## 2021-08-16 MED ORDER — ACETAMINOPHEN 500 MG PO TABS: 1000.0000 mg | ORAL_TABLET | Freq: Once | ORAL | Status: DC

## 2021-08-16 MED ORDER — HALOPERIDOL 0.5 MG PO TABS
0.5000 mg | ORAL_TABLET | Freq: Two times a day (BID) | ORAL | Status: DC
  Administered 2021-08-16 – 2021-08-20 (×8): 0.5 mg via ORAL
  Filled 2021-08-16 (×12): qty 1

## 2021-08-16 MED ORDER — ACETAMINOPHEN 325 MG PO TABS
650.0000 mg | ORAL_TABLET | Freq: Four times a day (QID) | ORAL | Status: DC | PRN
  Administered 2021-08-18 – 2021-08-22 (×2): 650 mg via ORAL
  Filled 2021-08-16 (×2): qty 2

## 2021-08-16 MED ORDER — OLANZAPINE 5 MG PO TABS
5.0000 mg | ORAL_TABLET | Freq: Every day | ORAL | Status: DC
  Administered 2021-08-16 – 2021-08-20 (×5): 5 mg via ORAL
  Filled 2021-08-16 (×7): qty 1

## 2021-08-16 MED ORDER — ONDANSETRON HCL 4 MG/2ML IJ SOLN
4.0000 mg | Freq: Once | INTRAMUSCULAR | Status: AC
  Administered 2021-08-16: 4 mg via INTRAVENOUS
  Filled 2021-08-16: qty 2

## 2021-08-16 MED ORDER — MORPHINE SULFATE (PF) 2 MG/ML IV SOLN
2.0000 mg | INTRAVENOUS | Status: DC | PRN
  Filled 2021-08-16: qty 1

## 2021-08-16 MED ORDER — MUPIROCIN 2 % EX OINT: 1.0000 "application " | TOPICAL_OINTMENT | Freq: Two times a day (BID) | CUTANEOUS | Status: DC

## 2021-08-16 MED ORDER — MORPHINE SULFATE (PF) 4 MG/ML IV SOLN
4.0000 mg | Freq: Once | INTRAVENOUS | Status: AC
  Administered 2021-08-16: 4 mg via INTRAVENOUS
  Filled 2021-08-16: qty 1

## 2021-08-16 MED ORDER — MEMANTINE HCL 10 MG PO TABS
5.0000 mg | ORAL_TABLET | Freq: Every day | ORAL | Status: DC
  Administered 2021-08-16 – 2021-08-21 (×5): 5 mg via ORAL
  Filled 2021-08-16 (×6): qty 1

## 2021-08-16 MED ORDER — TRAMADOL HCL 50 MG PO TABS
50.0000 mg | ORAL_TABLET | Freq: Four times a day (QID) | ORAL | Status: DC | PRN
  Administered 2021-08-18 – 2021-08-21 (×6): 50 mg via ORAL
  Filled 2021-08-16 (×6): qty 1

## 2021-08-16 MED ORDER — ONDANSETRON HCL 4 MG PO TABS: 4.0000 mg | ORAL_TABLET | Freq: Four times a day (QID) | ORAL | Status: DC | PRN

## 2021-08-16 MED ORDER — ATORVASTATIN CALCIUM 40 MG PO TABS
40.0000 mg | ORAL_TABLET | Freq: Every day | ORAL | Status: DC
  Administered 2021-08-16 – 2021-08-21 (×5): 40 mg via ORAL
  Filled 2021-08-16 (×6): qty 1

## 2021-08-16 MED ORDER — POVIDONE-IODINE 10 % EX SWAB: 2.0000 "application " | Freq: Once | CUTANEOUS | Status: DC

## 2021-08-16 MED ORDER — OXYCODONE HCL 5 MG PO TABS: 5.0000 mg | ORAL_TABLET | ORAL | Status: DC | PRN

## 2021-08-16 MED ORDER — FLUOXETINE HCL 10 MG PO TABS
5.0000 mg | ORAL_TABLET | Freq: Every day | ORAL | Status: DC
Start: 2021-08-16 — End: 2021-08-16

## 2021-08-16 MED ORDER — CHLORHEXIDINE GLUCONATE 4 % EX LIQD
60.0000 mL | Freq: Once | CUTANEOUS | Status: AC
  Administered 2021-08-17: 4 via TOPICAL

## 2021-08-16 MED ORDER — ACETAMINOPHEN 650 MG RE SUPP: 650.0000 mg | Freq: Four times a day (QID) | RECTAL | Status: DC | PRN

## 2021-08-16 MED ORDER — CEFAZOLIN IN SODIUM CHLORIDE 3-0.9 GM/100ML-% IV SOLN: 3.0000 g | INTRAVENOUS | Status: DC

## 2021-08-16 MED ORDER — TRANEXAMIC ACID-NACL 1000-0.7 MG/100ML-% IV SOLN
1000.0000 mg | INTRAVENOUS | Status: AC
  Administered 2021-08-17: 1000 mg via INTRAVENOUS
  Filled 2021-08-16: qty 100

## 2021-08-16 MED ORDER — ENOXAPARIN SODIUM 30 MG/0.3ML IJ SOSY
30.0000 mg | PREFILLED_SYRINGE | Freq: Once | INTRAMUSCULAR | Status: AC
  Administered 2021-08-16: 30 mg via SUBCUTANEOUS
  Filled 2021-08-16: qty 0.3

## 2021-08-16 MED ORDER — ENOXAPARIN SODIUM 40 MG/0.4ML IJ SOSY: 40.0000 mg | PREFILLED_SYRINGE | INTRAMUSCULAR | Status: DC

## 2021-08-16 MED ORDER — FLUOXETINE HCL 10 MG PO CAPS
10.0000 mg | ORAL_CAPSULE | Freq: Every day | ORAL | Status: DC
  Filled 2021-08-16: qty 1

## 2021-08-16 MED ORDER — LEVOTHYROXINE SODIUM 75 MCG PO TABS
75.0000 ug | ORAL_TABLET | Freq: Every day | ORAL | Status: DC
Start: 2021-08-16 — End: 2021-08-22
  Administered 2021-08-16 – 2021-08-22 (×6): 75 ug via ORAL
  Filled 2021-08-16 (×7): qty 1

## 2021-08-16 MED ORDER — ONDANSETRON HCL 4 MG/2ML IJ SOLN: 4.0000 mg | Freq: Four times a day (QID) | INTRAMUSCULAR | Status: DC | PRN

## 2021-08-16 MED ORDER — POVIDONE-IODINE 10 % EX SWAB
2.0000 "application " | Freq: Once | CUTANEOUS | Status: AC
  Administered 2021-08-17: 2 via TOPICAL

## 2021-08-16 MED ORDER — POTASSIUM CHLORIDE CRYS ER 20 MEQ PO TBCR
20.0000 meq | EXTENDED_RELEASE_TABLET | Freq: Every day | ORAL | Status: DC
  Administered 2021-08-16 – 2021-08-21 (×4): 20 meq via ORAL
  Filled 2021-08-16 (×5): qty 1

## 2021-08-16 MED ORDER — DIVALPROEX SODIUM 250 MG PO DR TAB
250.0000 mg | DELAYED_RELEASE_TABLET | Freq: Two times a day (BID) | ORAL | Status: DC
  Administered 2021-08-16 – 2021-08-21 (×9): 250 mg via ORAL
  Filled 2021-08-16 (×11): qty 1

## 2021-08-16 MED ORDER — INSULIN ASPART 100 UNIT/ML IJ SOLN
0.0000 [IU] | Freq: Three times a day (TID) | INTRAMUSCULAR | Status: DC
  Administered 2021-08-18: 2 [IU] via SUBCUTANEOUS
  Administered 2021-08-19: 3 [IU] via SUBCUTANEOUS
  Administered 2021-08-19: 2 [IU] via SUBCUTANEOUS
  Administered 2021-08-19 – 2021-08-20 (×2): 3 [IU] via SUBCUTANEOUS
  Administered 2021-08-21 – 2021-08-22 (×3): 2 [IU] via SUBCUTANEOUS

## 2021-08-16 NOTE — ED Notes (Signed)
Pt resting normal breathing pattern. Vitals baseline, family at bedside

## 2021-08-16 NOTE — ED Notes (Signed)
Pt's son just gave Pt 8 oz glucerna without RN knowledge

## 2021-08-16 NOTE — Progress Notes (Signed)
Consult received by Charma Igo, PA-C via EDP at [facility] for retroverted comminuted L femoral neck fx. Patient has DM2 and Alzheimer's, resides at memory care facility. Had GLF yesterday with L hip pain and inability to WB. Plan for L THA tomorrow at [facility] for pain control and to allow immediate mobilization OOB. Request TRH admit at [facility]. NPO after MN. Will give OTO Lovenox now, then hold blood thinners.

## 2021-08-16 NOTE — ED Notes (Signed)
Spoke with Bed placement. Patient is waiting for IP bed at [facility].

## 2021-08-16 NOTE — ED Notes (Signed)
Pt resting normal breathing pattern ?

## 2021-08-16 NOTE — Plan of Care (Signed)
  Problem: Activity: Goal: Risk for activity intolerance will decrease Outcome: Progressing   Problem: Nutrition: Goal: Adequate nutrition will be maintained Outcome: Progressing   Problem: Safety: Goal: Ability to remain free from injury will improve Outcome: Progressing   

## 2021-08-16 NOTE — H&P (Signed)
History and Physical    Beth Pennington  KPT:[id number]  DOB: Oct 23, 1940  DOA: 08/15/2021 PCP: System, Provider Not In   Patient coming from: Assisted living  Chief Complaint: Fall  HPI: Beth Pennington is a 81 y.o. female with medical history of Alzheimer's dementia, diabetes mellitus type 2, hypertension who lives in assisted living/memory care unit presented for fall and left hip pain.  The patient had dental work done and received medication for this.  When she returned to her facility, she fell.  Her family feels that this is likely due to the side effects of the pain medicine that she received. In the ED: Found to have a left hip fracture.  Directly admitted to [facility].  Orthopedic surgery has documented in the chart that the patient will have a left THA tomorrow. The patient has been mostly asleep today.  Her family who is at the bedside states that she appears to be quite comfortable.  ED Course: Morphine and Zofran in the ED.   Review of Systems:  All other systems reviewed and apart from HPI, are negative.  Past Medical History:  Diagnosis Date   Constipation    Dementia (HCC)    Diabetes mellitus without complication (HCC)    Hypercholesterolemia    Hypertension    Thyroid disease    Vitamin B12 deficiency     Past Surgical History:  Procedure Laterality Date   ABDOMINAL HYSTERECTOMY      Social History:   reports that she has quit smoking. Her smoking use included cigarettes. She has been exposed to tobacco smoke. She has never used smokeless tobacco. She reports that she does not currently use alcohol. She reports that she does not currently use drugs.  No Known Allergies  History reviewed. No pertinent family history.   Prior to Admission medications   Medication Sig Start Date End Date Taking? Authorizing Provider  atorvastatin (LIPITOR) 40 MG tablet Take 40 mg by mouth daily. 08/04/21  Yes [provider]  divalproex (DEPAKOTE) 250 MG  DR tablet Take 250 mg by mouth 2 (two) times daily. 07/21/21  Yes [provider]  Eyelid Cleansers (OCUSOFT LID SCRUB ORIGINAL) PADS Apply topically See admin instructions. Apply to both eyes at bedtime   Yes [provider]  FLUoxetine (PROZAC) 10 MG tablet Take 5 mg by mouth at bedtime.   Yes [provider]  haloperidol (HALDOL) 0.5 MG tablet Take 0.5 mg by mouth 2 (two) times daily. 06/16/21  Yes [provider]  levothyroxine (SYNTHROID) 75 MCG tablet Take 75 mcg by mouth daily. 07/29/21  Yes [provider]  losartan (COZAAR) 50 MG tablet Take 50 mg by mouth daily. 07/21/21  Yes [provider]  melatonin 3 MG TABS tablet Take 3 mg by mouth at bedtime.   Yes [provider]  memantine (NAMENDA) 5 MG tablet Take 5 mg by mouth daily. 08/04/21  Yes [provider]  metFORMIN (GLUCOPHAGE) 1000 MG tablet Take 1,000 mg by mouth 2 (two) times daily. 07/18/21  Yes [provider]  OLANZapine (ZYPREXA) 5 MG tablet Take 5 mg by mouth at bedtime. 08/04/21  Yes [provider]  potassium chloride SA (KLOR-CON M) 20 MEQ tablet Take 20 mEq by mouth daily. 07/21/21  Yes [provider]  PSYLLIUM PO Take by mouth See admin instructions. 1 capsule qd, changed order 08-11-21 from 0.52gm   Yes [provider]  Skin Protectants, Misc. (BAZA PROTECT EX) See admin instructions.  Apply topically to sacral area twice daily for protection   Yes [provider]  vitamin B-12 (CYANOCOBALAMIN) 1000 MCG tablet Take 1,000 mcg by mouth daily.   Yes [provider]  FLUoxetine (PROZAC) 10 MG capsule Take 10 mg by mouth daily. 07/13/21   [provider]  Vitamin D, Ergocalciferol, (DRISDOL) 1.25 MG (50000 UNIT) CAPS capsule Take 50,000 Units by mouth once a week. Patient not taking: Reported on 08/16/2021 07/25/21   [provider]    Physical Exam: Wt Readings from Last 3 Encounters:  No data  found for Wt   Vitals:   08/16/21 1130 08/16/21 1200 08/16/21 1230 08/16/21 1410  BP: (!) 163/89 (!) 180/83 (!) 178/92 (!) 167/88  Pulse: 79 89 83 80  Resp: 12 11 10 14   Temp:    98.3 F (36.8 C)  TempSrc:    Oral  SpO2: 100% 100% 100% 100%      Constitutional: Sleeping-calm & comfortable when she awakens-falls back asleep easily Eyes: PERRLA, lids and conjunctivae normal ENT:  Mucous membranes are dry Pharynx clear of exudate   Normal dentition.  Respiratory:  Clear to auscultation bilaterally  Normal respiratory effort.  Cardiovascular:  S1 & S2 heard, regular rate and rhythm No Murmurs Abdomen:  Non distended No tenderness, No masses Bowel sounds normal Extremities:  No clubbing / cyanosis No pedal edema  Skin:  No rashes, lesions or ulcers Neurologic:  AAO x 1 CN 2-12 grossly intact-follows commands  Psychiatric:  Quite sleepy-difficult to assess    Labs on Admission: I have personally reviewed following labs and imaging studies  CBC: Recent Labs  Lab 08/15/21 2010  WBC 9.1  NEUTROABS 7.4  HGB 12.4  HCT 36.6  MCV 92.0  PLT 203   Basic Metabolic Panel: Recent Labs  Lab 08/15/21 2010  NA 134*  K 4.5  CL 95*  CO2 24  GLUCOSE 120*  BUN 20  CREATININE 0.92  CALCIUM 9.7   GFR: CrCl cannot be calculated (Unknown ideal weight.). Liver Function Tests: No results for input(s): AST, ALT, ALKPHOS, BILITOT, PROT, ALBUMIN in the last 168 hours. No results for input(s): LIPASE, AMYLASE in the last 168 hours. No results for input(s): AMMONIA in the last 168 hours. Coagulation Profile: No results for input(s): INR, PROTIME in the last 168 hours. Cardiac Enzymes: No results for input(s): CKTOTAL, CKMB, CKMBINDEX, TROPONINI in the last 168 hours. BNP (last 3 results) No results for input(s): PROBNP in the last 8760 hours. HbA1C: No results for input(s): HGBA1C in the last 72 hours. CBG: Recent Labs  Lab 08/16/21 1233 08/16/21 1300  GLUCAP 75  99   Lipid Profile: No results for input(s): CHOL, HDL, LDLCALC, TRIG, CHOLHDL, LDLDIRECT in the last 72 hours. Thyroid Function Tests: No results for input(s): TSH, T4TOTAL, FREET4, T3FREE, THYROIDAB in the last 72 hours. Anemia Panel: No results for input(s): VITAMINB12, FOLATE, FERRITIN, TIBC, IRON, RETICCTPCT in the last 72 hours. Urine analysis: No results found for: COLORURINE, APPEARANCEUR, LABSPEC, PHURINE, GLUCOSEU, HGBUR, BILIRUBINUR, KETONESUR, PROTEINUR, UROBILINOGEN, NITRITE, LEUKOCYTESUR Sepsis Labs: @LABRCNTIP (procalcitonin:4,lacticidven:4) )No results found for this or any previous visit (from the past 240 hour(s)).   Radiological Exams on Admission: DG Chest Portable 1 View  Result Date: 08/15/2021 CLINICAL DATA:  fall EXAM: PORTABLE CHEST 1 VIEW.  Patient is rotated. COMPARISON:  None Available. FINDINGS: The heart and mediastinal contours are within normal limits. No focal consolidation. No pulmonary edema. No pleural effusion. No pneumothorax. No acute osseous abnormality. IMPRESSION: No  active disease. Electronically Signed   By: Tish Frederickson M.D.   On: 08/15/2021 20:39   DG Knee Left Port  Result Date: 08/16/2021 CLINICAL DATA:  LEFT hip fracture EXAM: PORTABLE LEFT KNEE - 1-2 VIEW COMPARISON:  Pelvis 08/15/2021 FINDINGS: No fracture of the proximal tibia or distal femur. Patella is normal. No joint effusion. IMPRESSION: No fracture or dislocation. Electronically Signed   By: Genevive Bi M.D.   On: 08/16/2021 11:19   DG Hip Unilat W or Wo Pelvis 2-3 Views Left  Result Date: 08/15/2021 CLINICAL DATA:  Trauma, fall EXAM: DG HIP (WITH OR WITHOUT PELVIS) 2-3V LEFT COMPARISON:  None Available. FINDINGS: There is comminuted subcapital fracture of neck of left femur. There is no dislocation. Osteopenia is seen in bony structures. IMPRESSION: There is comminuted, impacted fracture in the subcapital portion of neck of left femur. Electronically Signed   By: Ernie Avena M.D.   On: 08/15/2021 17:47    EKG: Independently reviewed.  Normal sinus rhythm  Assessment/Plan Principal Problem:   Closed left hip fracture, initial encounter (HCC) -Given Lovenox this afternoon-hold further Lovenox -Plan for left THA tomorrow -N.p.o. after midnight - Tylenol, tramadol and IV morphine ordered for pain control -I have rechecked a CBC and hemoglobin remained stable  Active Problems:     DM (diabetes mellitus), type 2 (HCC), not stated as uncontrolled -Sliding scale insulin ordered -Metformin placed on hold  Hypertension - Hold losartan  Hypothyroidism - Continue levothyroxine    Dementia with behavioral disturbance (HCC) -All home meds resumed including Depakote, Haldol, olanzapine, Namenda and Prozac-confirmed with family that these are the correct medications     DVT prophylaxis: Lovenox given one-time Code Status: Full code Consults called: Orthopedic surgery Admission status:  Level of care: Med-Surg  Calvert Cantor MD [facility]    08/16/2021, 3:13 PM

## 2021-08-16 NOTE — ED Notes (Signed)
Pt cleaned and bedding replaced. Placed on 2L  due to desaturation after pain meds

## 2021-08-16 NOTE — ED Notes (Signed)
Report given to [facility] 

## 2021-08-17 ENCOUNTER — Inpatient Hospital Stay (HOSPITAL_COMMUNITY): Payer: Medicare Other

## 2021-08-17 ENCOUNTER — Encounter (HOSPITAL_COMMUNITY): Payer: Self-pay | Admitting: Internal Medicine

## 2021-08-17 ENCOUNTER — Other Ambulatory Visit: Payer: Self-pay

## 2021-08-17 ENCOUNTER — Inpatient Hospital Stay (HOSPITAL_COMMUNITY): Payer: Medicare Other | Admitting: Anesthesiology

## 2021-08-17 ENCOUNTER — Encounter (HOSPITAL_COMMUNITY)
Admission: EM | Disposition: A | Discharge status: Skilled Nursing Facility | Payer: Self-pay | Source: Skilled Nursing Facility | Attending: Internal Medicine

## 2021-08-17 DIAGNOSIS — E119 Type 2 diabetes mellitus without complications: Secondary | ICD-10-CM

## 2021-08-17 DIAGNOSIS — Z87891 Personal history of nicotine dependence: Secondary | ICD-10-CM

## 2021-08-17 DIAGNOSIS — Z7984 Long term (current) use of oral hypoglycemic drugs: Secondary | ICD-10-CM

## 2021-08-17 DIAGNOSIS — E1169 Type 2 diabetes mellitus with other specified complication: Secondary | ICD-10-CM | POA: Diagnosis not present

## 2021-08-17 DIAGNOSIS — F03918 Unspecified dementia, unspecified severity, with other behavioral disturbance: Secondary | ICD-10-CM | POA: Diagnosis not present

## 2021-08-17 DIAGNOSIS — I10 Essential (primary) hypertension: Secondary | ICD-10-CM

## 2021-08-17 DIAGNOSIS — S72002A Fracture of unspecified part of neck of left femur, initial encounter for closed fracture: Secondary | ICD-10-CM

## 2021-08-17 HISTORY — PX: TOTAL HIP ARTHROPLASTY: SHX124

## 2021-08-17 LAB — CBC
HCT: 38.4 % (ref 36.0–46.0)
Hemoglobin: 12.7 g/dL (ref 12.0–15.0)
MCH: 31.6 pg (ref 26.0–34.0)
MCHC: 33.1 g/dL (ref 30.0–36.0)
MCV: 95.5 fL (ref 80.0–100.0)
Platelets: 197 10*3/uL (ref 150–400)
RBC: 4.02 MIL/uL (ref 3.87–5.11)
RDW: 13.5 % (ref 11.5–15.5)
WBC: 10.1 10*3/uL (ref 4.0–10.5)
nRBC: 0 % (ref 0.0–0.2)

## 2021-08-17 LAB — SARS CORONAVIRUS 2 (TAT 6-24 HRS): SARS Coronavirus 2: NEGATIVE

## 2021-08-17 LAB — GLUCOSE, CAPILLARY
Glucose-Capillary: 132 mg/dL — ABNORMAL HIGH (ref 70–99)
Glucose-Capillary: 112 mg/dL — ABNORMAL HIGH (ref 70–99)
Glucose-Capillary: 131 mg/dL — ABNORMAL HIGH (ref 70–99)
Glucose-Capillary: 116 mg/dL — ABNORMAL HIGH (ref 70–99)
Glucose-Capillary: 227 mg/dL — ABNORMAL HIGH (ref 70–99)

## 2021-08-17 SURGERY — ARTHROPLASTY, HIP, TOTAL, ANTERIOR APPROACH
Anesthesia: Spinal | Site: Hip | Laterality: Left

## 2021-08-17 MED ORDER — WATER FOR IRRIGATION, STERILE IR SOLN
Status: DC | PRN
  Administered 2021-08-17: 2000 mL

## 2021-08-17 MED ORDER — PHENYLEPHRINE HCL-NACL 20-0.9 MG/250ML-% IV SOLN
INTRAVENOUS | Status: DC | PRN
  Administered 2021-08-17: 50 ug/min via INTRAVENOUS

## 2021-08-17 MED ORDER — BUPIVACAINE-EPINEPHRINE 0.25% -1:200000 IJ SOLN
INTRAMUSCULAR | Status: DC | PRN
  Administered 2021-08-17: 30 mL

## 2021-08-17 MED ORDER — ONDANSETRON HCL 4 MG/2ML IJ SOLN
INTRAMUSCULAR | Status: AC
  Filled 2021-08-17: qty 2

## 2021-08-17 MED ORDER — ONDANSETRON HCL 4 MG/2ML IJ SOLN
INTRAMUSCULAR | Status: DC | PRN
  Administered 2021-08-17: 4 mg via INTRAVENOUS

## 2021-08-17 MED ORDER — ACETAMINOPHEN 10 MG/ML IV SOLN
INTRAVENOUS | Status: DC | PRN
  Administered 2021-08-17: 1000 mg via INTRAVENOUS

## 2021-08-17 MED ORDER — SODIUM CHLORIDE 0.9 % IR SOLN
Status: DC | PRN
  Administered 2021-08-17: 1000 mL
  Administered 2021-08-17: 3000 mL

## 2021-08-17 MED ORDER — LACTATED RINGERS IV SOLN: INTRAVENOUS | Status: DC | PRN

## 2021-08-17 MED ORDER — PHENYLEPHRINE HCL (PRESSORS) 10 MG/ML IV SOLN
INTRAVENOUS | Status: DC | PRN
  Administered 2021-08-17 (×7): 80 ug via INTRAVENOUS

## 2021-08-17 MED ORDER — PROPOFOL 1000 MG/100ML IV EMUL
INTRAVENOUS | Status: AC
  Filled 2021-08-17: qty 100

## 2021-08-17 MED ORDER — SODIUM CHLORIDE (PF) 0.9 % IJ SOLN
INTRAMUSCULAR | Status: AC
  Filled 2021-08-17: qty 50

## 2021-08-17 MED ORDER — DEXAMETHASONE SODIUM PHOSPHATE 10 MG/ML IJ SOLN
INTRAMUSCULAR | Status: AC
  Filled 2021-08-17: qty 1

## 2021-08-17 MED ORDER — ISOPROPYL ALCOHOL 70 % SOLN
Status: DC | PRN
  Administered 2021-08-17: 1 via TOPICAL

## 2021-08-17 MED ORDER — 0.9 % SODIUM CHLORIDE (POUR BTL) OPTIME
TOPICAL | Status: DC | PRN
  Administered 2021-08-17: 1000 mL

## 2021-08-17 MED ORDER — SODIUM CHLORIDE (PF) 0.9 % IJ SOLN
INTRAMUSCULAR | Status: DC | PRN
  Administered 2021-08-17: 30 mL

## 2021-08-17 MED ORDER — PHENYLEPHRINE HCL-NACL 20-0.9 MG/250ML-% IV SOLN
INTRAVENOUS | Status: AC
  Filled 2021-08-17: qty 250

## 2021-08-17 MED ORDER — KETOROLAC TROMETHAMINE 30 MG/ML IJ SOLN
INTRAMUSCULAR | Status: AC
  Filled 2021-08-17: qty 1

## 2021-08-17 MED ORDER — LABETALOL HCL 5 MG/ML IV SOLN: 10.0000 mg | INTRAVENOUS | Status: DC | PRN

## 2021-08-17 MED ORDER — BUPIVACAINE-EPINEPHRINE (PF) 0.25% -1:200000 IJ SOLN
INTRAMUSCULAR | Status: AC
  Filled 2021-08-17: qty 30

## 2021-08-17 MED ORDER — KETOROLAC TROMETHAMINE 30 MG/ML IJ SOLN
INTRAMUSCULAR | Status: DC | PRN
  Administered 2021-08-17: 30 mg

## 2021-08-17 MED ORDER — ACETAMINOPHEN 10 MG/ML IV SOLN
INTRAVENOUS | Status: AC
  Filled 2021-08-17: qty 100

## 2021-08-17 MED ORDER — FENTANYL CITRATE PF 50 MCG/ML IJ SOSY: 25.0000 ug | PREFILLED_SYRINGE | INTRAMUSCULAR | Status: DC | PRN

## 2021-08-17 MED ORDER — BUPIVACAINE IN DEXTROSE 0.75-8.25 % IT SOLN
INTRATHECAL | Status: DC | PRN
  Administered 2021-08-17: 1.6 mL via INTRATHECAL

## 2021-08-17 MED ORDER — PROPOFOL 500 MG/50ML IV EMUL
INTRAVENOUS | Status: DC | PRN
  Administered 2021-08-17: 100 ug/kg/min via INTRAVENOUS

## 2021-08-17 MED ORDER — PROPOFOL 10 MG/ML IV BOLUS
INTRAVENOUS | Status: DC | PRN
  Administered 2021-08-17: 30 mg via INTRAVENOUS

## 2021-08-17 SURGICAL SUPPLY — 64 items
ACE SHELL 3H 52 E HIP (Shell) ×2 IMPLANT
BAG COUNTER SPONGE SURGICOUNT (BAG) ×1 IMPLANT
BAG DECANTER FOR FLEXI CONT (MISCELLANEOUS) IMPLANT
BAG ZIPLOCK 12X15 (MISCELLANEOUS) IMPLANT
CHLORAPREP W/TINT 26 (MISCELLANEOUS) ×2 IMPLANT
COVER PERINEAL POST (MISCELLANEOUS) ×2 IMPLANT
COVER SURGICAL LIGHT HANDLE (MISCELLANEOUS) ×2 IMPLANT
DERMABOND ADVANCED (GAUZE/BANDAGES/DRESSINGS) ×1
DERMABOND ADVANCED .7 DNX12 (GAUZE/BANDAGES/DRESSINGS) ×2 IMPLANT
DRAPE IMP U-DRAPE 54X76 (DRAPES) ×2 IMPLANT
DRAPE SHEET LG 3/4 BI-LAMINATE (DRAPES) ×6 IMPLANT
DRAPE STERI IOBAN 125X83 (DRAPES) ×2 IMPLANT
DRAPE U-SHAPE 47X51 STRL (DRAPES) ×4 IMPLANT
DRESSING AQUACEL AG SP 3.5X10 (GAUZE/BANDAGES/DRESSINGS) IMPLANT
DRSG AQUACEL AG ADV 3.5X10 (GAUZE/BANDAGES/DRESSINGS) ×2 IMPLANT
DRSG AQUACEL AG SP 3.5X10 (GAUZE/BANDAGES/DRESSINGS) ×2
ELECT REM PT RETURN 15FT ADLT (MISCELLANEOUS) ×2 IMPLANT
GAUZE SPONGE 4X4 12PLY STRL (GAUZE/BANDAGES/DRESSINGS) ×2 IMPLANT
GLOVE BIO SURGEON STRL SZ8.5 (GLOVE) ×4 IMPLANT
GLOVE BIOGEL M 7.0 STRL (GLOVE) ×2 IMPLANT
GLOVE BIOGEL PI IND STRL 7.5 (GLOVE) ×1 IMPLANT
GLOVE BIOGEL PI IND STRL 8 (GLOVE) ×1 IMPLANT
GLOVE BIOGEL PI IND STRL 8.5 (GLOVE) ×1 IMPLANT
GLOVE BIOGEL PI INDICATOR 7.5 (GLOVE) ×1
GLOVE BIOGEL PI INDICATOR 8 (GLOVE) ×1
GLOVE BIOGEL PI INDICATOR 8.5 (GLOVE) ×1
GLOVE SURG LX 7.5 STRW (GLOVE) ×2
GLOVE SURG LX STRL 7.5 STRW (GLOVE) ×2 IMPLANT
GOWN SPEC L3 XXLG W/TWL (GOWN DISPOSABLE) ×4 IMPLANT
HANDPIECE INTERPULSE COAX TIP (DISPOSABLE) ×1
HEAD FEM -6XOFST 36XMDLR (Orthopedic Implant) IMPLANT
HEAD MODULAR 36MM (Orthopedic Implant) ×1 IMPLANT
HOLDER FOLEY CATH W/STRAP (MISCELLANEOUS) ×2 IMPLANT
HOOD PEEL AWAY FLYTE STAYCOOL (MISCELLANEOUS) ×6 IMPLANT
KIT TURNOVER KIT A (KITS) ×1 IMPLANT
LINER ACE G7 HIGH 36 SZ E (Liner) ×1 IMPLANT
MANIFOLD NEPTUNE II (INSTRUMENTS) ×2 IMPLANT
MARKER SKIN DUAL TIP RULER LAB (MISCELLANEOUS) ×2 IMPLANT
NDL SAFETY ECLIPSE 18X1.5 (NEEDLE) ×1 IMPLANT
NDL SPNL 18GX3.5 QUINCKE PK (NEEDLE) ×1 IMPLANT
NEEDLE HYPO 18GX1.5 SHARP (NEEDLE) ×1
NEEDLE SPNL 18GX3.5 QUINCKE PK (NEEDLE) ×2 IMPLANT
PACK ANTERIOR HIP CUSTOM (KITS) ×2 IMPLANT
PENCIL SMOKE EVACUATOR (MISCELLANEOUS) IMPLANT
SAW OSC TIP CART 19.5X105X1.3 (SAW) ×2 IMPLANT
SEALER BIPOLAR AQUA 6.0 (INSTRUMENTS) ×2 IMPLANT
SET HNDPC FAN SPRY TIP SCT (DISPOSABLE) ×1 IMPLANT
SHELL ACETAB 3H 52 E HIP (Shell) IMPLANT
SOLUTION PRONTOSAN WOUND 350ML (IRRIGATION / IRRIGATOR) ×2 IMPLANT
SPIKE FLUID TRANSFER (MISCELLANEOUS) ×2 IMPLANT
STAPLER INSORB 30 2030 C-SECTI (MISCELLANEOUS) ×1 IMPLANT
STEM FEM TAPERLOC CMTLS 17X140 (Stem) ×1 IMPLANT
SUT MNCRL AB 3-0 PS2 18 (SUTURE) ×2 IMPLANT
SUT MON AB 2-0 CT1 36 (SUTURE) ×2 IMPLANT
SUT STRATAFIX PDO 1 14 VIOLET (SUTURE) ×1
SUT STRATFX PDO 1 14 VIOLET (SUTURE) ×1
SUT VIC AB 2-0 CT1 27 (SUTURE) ×1
SUT VIC AB 2-0 CT1 TAPERPNT 27 (SUTURE) IMPLANT
SUTURE STRATFX PDO 1 14 VIOLET (SUTURE) ×1 IMPLANT
SYR 3ML LL SCALE MARK (SYRINGE) ×2 IMPLANT
TRAY FOLEY MTR SLVR 14FR STAT (SET/KITS/TRAYS/PACK) ×1 IMPLANT
TRAY FOLEY MTR SLVR 16FR STAT (SET/KITS/TRAYS/PACK) IMPLANT
TUBE SUCTION HIGH CAP CLEAR NV (SUCTIONS) ×2 IMPLANT
WATER STERILE IRR 1000ML POUR (IV SOLUTION) ×2 IMPLANT

## 2021-08-17 NOTE — Progress Notes (Signed)
Notified physician regarding patient;s elevated BP. Awaiting response/orders.

## 2021-08-17 NOTE — Consult Note (Signed)
ORTHOPAEDIC CONSULTATION  REQUESTING PHYSICIAN: Calvert Cantor, MD  PCP:  System, Provider Not In  Chief Complaint: Left hip pain after fall  HPI: Beth Pennington is a 81 y.o. female with history of DM2 and Alzheimer's, resides at memory care facility. She presented to the ED for evaluation of an unwitnessed ground-level fall that occurred 08/15/21. Patient had a dental procedure earlier that day with anesthesia that was performed without complication. Patient had pain in her hip. History otherwise difficult due to degree of Alzheimer's. Patient's daughter in law present during exam states that she is not on blood thinners and provided some history information. Daughter-in-law does state that she has had some recent decline with shuffling of her feet when ambulating and worsening daily function.  ED course: Left hip x-ray  was obtained and patient was found to have a comminuted impacted fracture of the left femoral neck. Left knee x-ray was unremarkable. Ortho was consulted.   Past Medical History:  Diagnosis Date   Constipation    Dementia (HCC)    Diabetes mellitus without complication (HCC)    Hypercholesterolemia    Hypertension    Thyroid disease    Vitamin B12 deficiency    Past Surgical History:  Procedure Laterality Date   ABDOMINAL HYSTERECTOMY     Social History   Socioeconomic History   Marital status: Widowed    Spouse name: Not on file   Number of children: Not on file   Years of education: Not on file   Highest education level: Not on file  Occupational History   Not on file  Tobacco Use   Smoking status: Former    Types: Cigarettes    Passive exposure: Past   Smokeless tobacco: Never  Vaping Use   Vaping Use: Never used  Substance and Sexual Activity   Alcohol use: Not Currently   Drug use: Not Currently   Sexual activity: Not Currently  Other Topics Concern   Not on file  Social History Narrative   Not on file   Social Determinants of Health    Financial Resource Strain: Not on file  Food Insecurity: Not on file  Transportation Needs: Not on file  Physical Activity: Not on file  Stress: Not on file  Social Connections: Not on file   History reviewed. No pertinent family history. No Known Allergies Prior to Admission medications   Medication Sig Start Date End Date Taking? Authorizing Provider  atorvastatin (LIPITOR) 40 MG tablet Take 40 mg by mouth daily. 08/04/21  Yes [provider]  divalproex (DEPAKOTE) 250 MG DR tablet Take 250 mg by mouth 2 (two) times daily. 07/21/21  Yes [provider]  Eyelid Cleansers (OCUSOFT LID SCRUB ORIGINAL) PADS Apply topically See admin instructions. Apply to both eyes at bedtime   Yes [provider]  FLUoxetine (PROZAC) 10 MG tablet Take 5 mg by mouth at bedtime.   Yes [provider]  haloperidol (HALDOL) 0.5 MG tablet Take 0.5 mg by mouth 2 (two) times daily. 06/16/21  Yes [provider]  levothyroxine (SYNTHROID) 75 MCG tablet Take 75 mcg by mouth daily. 07/29/21  Yes [provider]  losartan (COZAAR) 50 MG tablet Take 50 mg by mouth daily. 07/21/21  Yes [provider]  melatonin 3 MG TABS tablet Take 3 mg by mouth at bedtime.   Yes [provider]  memantine (NAMENDA) 5 MG tablet Take 5 mg by mouth daily. 08/04/21  Yes [provider]  metFORMIN (GLUCOPHAGE)  1000 MG tablet Take 1,000 mg by mouth 2 (two) times daily. 07/18/21  Yes [provider]  OLANZapine (ZYPREXA) 5 MG tablet Take 5 mg by mouth at bedtime. 08/04/21  Yes [provider]  potassium chloride SA (KLOR-CON M) 20 MEQ tablet Take 20 mEq by mouth daily. 07/21/21  Yes [provider]  PSYLLIUM PO Take by mouth See admin instructions. 1 capsule qd, changed order 08-11-21 from 0.52gm   Yes [provider]  Skin Protectants, Misc. (BAZA PROTECT EX) See admin instructions. Apply topically to sacral area twice daily for  protection   Yes [provider]  vitamin B-12 (CYANOCOBALAMIN) 1000 MCG tablet Take 1,000 mcg by mouth daily.   Yes [provider]  FLUoxetine (PROZAC) 10 MG capsule Take 10 mg by mouth daily. 07/13/21   [provider]  Vitamin D, Ergocalciferol, (DRISDOL) 1.25 MG (50000 UNIT) CAPS capsule Take 50,000 Units by mouth once a week. Patient not taking: Reported on 08/16/2021 07/25/21   [provider]   DG Knee Left Port  Result Date: 08/16/2021 CLINICAL DATA:  LEFT hip fracture EXAM: PORTABLE LEFT KNEE - 1-2 VIEW COMPARISON:  Pelvis 08/15/2021 FINDINGS: No fracture of the proximal tibia or distal femur. Patella is normal. No joint effusion. IMPRESSION: No fracture or dislocation. Electronically Signed   By: Genevive Bi M.D.   On: 08/16/2021 11:19   DG Chest Portable 1 View  Result Date: 08/15/2021 CLINICAL DATA:  fall EXAM: PORTABLE CHEST 1 VIEW.  Patient is rotated. COMPARISON:  None Available. FINDINGS: The heart and mediastinal contours are within normal limits. No focal consolidation. No pulmonary edema. No pleural effusion. No pneumothorax. No acute osseous abnormality. IMPRESSION: No active disease. Electronically Signed   By: Tish Frederickson M.D.   On: 08/15/2021 20:39   DG Hip Unilat W or Wo Pelvis 2-3 Views Left  Result Date: 08/15/2021 CLINICAL DATA:  Trauma, fall EXAM: DG HIP (WITH OR WITHOUT PELVIS) 2-3V LEFT COMPARISON:  None Available. FINDINGS: There is comminuted subcapital fracture of neck of left femur. There is no dislocation. Osteopenia is seen in bony structures. IMPRESSION: There is comminuted, impacted fracture in the subcapital portion of neck of left femur. Electronically Signed   By: Ernie Avena M.D.   On: 08/15/2021 17:47    Positive ROS: All other systems have been reviewed and were otherwise negative with the exception of those mentioned in the HPI and as above.  Physical Exam: General: Patient was sleeping and lying in bed  with daughter-in-law at bedside. NAD. She was woken up for her exam and was pleasant. Physical exam limited due to patients confusion, speech difficulty, and not following commands. Baseline Alzheimer's.  Cardiovascular: No pedal edema Respiratory: No cyanosis, no use of accessory musculature GI: No organomegaly, abdomen is soft and non-tender Skin: No lesions in the area of chief complaint Psychiatric: Patient not competent for consent. Lymphatic: No axillary or cervical lymphadenopathy  MUSCULOSKELETAL: Examination of the left hip reveals no skin wounds, lesions, rashes or erythema. No obvious shortening or rotation of the lower extremity. Exam limited due to unable to understand patient and not following commands. Patient has facial grimacing with movement of the LLE. Difficult to assess sensory and motor function of LLE. Patient moved left foot slightly on her own but not on command. Distal pulses 2+ bilaterally.   Assessment: Retroverted comminuted L femoral neck fx  Plan: Unstable left femoral neck fracture. Patient will  require surgical treatment for pain control and allow  immediate mobilization out of bed. TRH consulted for admission, and perioperative medical optimization. Plan for right hip total hip arthroplasty this afternoon. Held chemical DVT prophylaxis and patient has been NPO since midnight.   All questions solicited and answered from daughter-in-law.     Clois Dupesvery S Lillian Ballester, PA-C    08/17/2021 7:24 AM

## 2021-08-17 NOTE — Anesthesia Procedure Notes (Signed)
Spinal  Patient location during procedure: OR Start time: 08/17/2021 1:35 PM End time: 08/17/2021 1:40 PM Reason for block: surgical anesthesia Staffing Performed: anesthesiologist  Anesthesiologist: Gaynelle Adu, MD Performed by: Gaynelle Adu, MD Authorized by: Gaynelle Adu, MD   Preanesthetic Checklist Completed: patient identified, IV checked, risks and benefits discussed, surgical consent, monitors and equipment checked, pre-op evaluation and timeout performed Spinal Block Patient position: right lateral decubitus Prep: DuraPrep Patient monitoring: cardiac monitor, continuous pulse ox and blood pressure Approach: midline Location: L3-4 Injection technique: single-shot Needle Needle type: Quincke  Needle gauge: 22 G Needle length: 9 cm Assessment Sensory level: T8 Events: CSF return Additional Notes Functioning IV was confirmed and monitors were applied. Sterile prep and drape, including hand hygiene and sterile gloves were used. The patient was positioned and the spine was prepped. The skin was anesthetized with lidocaine.  Free flow of clear CSF was obtained prior to injecting local anesthetic into the CSF.  The spinal needle aspirated freely following injection.  The needle was carefully withdrawn.  The patient tolerated the procedure well.

## 2021-08-17 NOTE — Progress Notes (Signed)
[facility] Progress Note  Patient: Beth Pennington     IWP:[id number]  DOA: 08/15/2021   PCP: System, Provider Not In       Brief hospital course: This is an 81 year old female with Alzheimer's dementia, diabetes mellitus type 2, hypertension who lives in assisted living/memory care and presented to the hospital after a fall and left hip pain. Found to have a left hip fracture in the ED. Underwent total left hip arthroplasty.  Subjective:  Evaluated this morning prior to surgery.  She had no complaints  Assessment and Plan: Principal Problem:   Closed left hip fracture, initial encounter (HCC) -Management per orthopedic surgery-we will follow-up on PT eval - If she is able to, it would be preferable for her to return to her memory care unit.  However, if she is unable to ambulate adequately we will need to find a skilled nursing facility  Active Problems:   DM (diabetes mellitus), type 2 (HCC) -Continue to hold metformin and treat with NovoLog sliding scale    Dementia with behavioral disturbance (HCC) -Continue Depakote, Haldol, olanzapine, Namenda and Prozac which have been confirmed with the patient's family  Hypertension - Holding losartan as acute blood loss from a fracture can result in AKI - Have ordered labetalol as needed - If she needs excessive pain medication, her blood pressure may improve-we will follow  Hypothyroidism - Continue levothyroxine  Body mass index is 21.29 kg/m.   DVT prophylaxis: Ortho will be ordering this once they have completed postop orders   Code Status: DNR  Consultants: Orthopedic surgery Level of Care: Level of care: Med-Surg Disposition Plan:  Status is: Inpatient Remains inpatient appropriate because: Needs PT eval after left hip fracture repair  Objective:   Vitals:   08/17/21 1216 08/17/21 1246 08/17/21 1522 08/17/21 1525  BP: (!) 154/87  (!) 95/53 (!) 98/55  Pulse: 97  90   Resp: 16  13   Temp:   97.8 F (36.6  C)   TempSrc:      SpO2: 100%  98%   Weight:  52.8 kg    Height:  5\' 2"  (1.575 m)     Filed Weights   08/16/21 1733 08/17/21 1246  Weight: 52.8 kg 52.8 kg   Exam: General exam: Appears comfortable  HEENT: PERRLA, oral mucosa moist, no sclera icterus or thrush Respiratory system: Clear to auscultation. Respiratory effort normal. Cardiovascular system: S1 & S2 heard, regular rate and rhythm Gastrointestinal system: Abdomen soft, non-tender, nondistended. Normal bowel sounds   Central nervous system: Alert and oriented x1. No focal neurological deficits. Extremities: No cyanosis, clubbing or edema Skin: No rashes or ulcers Psychiatry:  Mood & affect appropriate.    Imaging and lab data was personally reviewed    CBC: Recent Labs  Lab 08/15/21 2010 08/16/21 1558 08/17/21 0750  WBC 9.1 7.3 10.1  NEUTROABS 7.4 4.4  --   HGB 12.4 12.2 12.7  HCT 36.6 36.3 38.4  MCV 92.0 95.3 95.5  PLT 203 193 197   Basic Metabolic Panel: Recent Labs  Lab 08/15/21 2010 08/16/21 1558  NA 134* 139  K 4.5 4.4  CL 95* 103  CO2 24 28  GLUCOSE 120* 113*  BUN 20 16  CREATININE 0.92 1.00  CALCIUM 9.7 9.2   GFR: Estimated Creatinine Clearance: 35.5 mL/min (by C-G formula based on SCr of 1 mg/dL).  Scheduled Meds:  acetaminophen  1,000 mg Oral Once   [MAR Hold] atorvastatin  40 mg Oral Daily   [  MAR Hold] divalproex  250 mg Oral BID   [MAR Hold] FLUoxetine  5 mg Oral Daily   [MAR Hold] haloperidol  0.5 mg Oral BID   [MAR Hold] insulin aspart  0-15 Units Subcutaneous TID WC   [MAR Hold] levothyroxine  75 mcg Oral Daily   [MAR Hold] memantine  5 mg Oral Daily   [MAR Hold] OLANZapine  5 mg Oral QHS   [MAR Hold] potassium chloride SA  20 mEq Oral Daily   Continuous Infusions:   LOS: 1 day   Author: Calvert Cantor  08/17/2021 3:41 PM

## 2021-08-17 NOTE — Plan of Care (Signed)

## 2021-08-17 NOTE — Op Note (Signed)
OPERATIVE REPORT  SURGEON: Rod Can, MD   ASSISTANT: Larene Pickett, PA-C  PREOPERATIVE DIAGNOSIS: Displaced Left femoral neck fracture.   POSTOPERATIVE DIAGNOSIS: Displaced Left femoral neck fracture.   PROCEDURE: Left total hip arthroplasty, anterior approach.   IMPLANTS: Biomet Taperloc Reduced Distal stem, size 10 x 140 mm, high offset. Biomet G7 OsseoTi Cup, size 52 mm. Biomet Vivacit-E liner, size 36 mm, E, neutral. Biomet metal head ball, size 36 - 6 mm.  ANESTHESIA:  MAC and Spinal  ANTIBIOTICS: 2g ancef.  ESTIMATED BLOOD LOSS:-200 mL    DRAINS: None.  COMPLICATIONS: None   CONDITION: PACU - hemodynamically stable.   BRIEF CLINICAL NOTE: Beth Pennington is a 81 y.o. female with a displaced Left femoral neck fracture. The patient was admitted to the hospitalist service and underwent perioperative risk stratification and medical optimization. The risks, benefits, and alternatives to total hip arthroplasty were explained, and the patient elected to proceed.  PROCEDURE IN DETAIL: The patient was taken to the operating room and general anesthesia was induced on the hospital bed.  The patient was then positioned on the Hana table.  All bony prominences were well padded.  The hip was prepped and draped in the normal sterile surgical fashion.  A time-out was called verifying side and site of surgery. Antibiotics were given within 60 minutes of beginning the procedure.   Bikini incision was made, and the direct anterior approach to the hip was performed through the Hueter interval.  Lateral femoral circumflex vessels were treated with the Auqumantys. The anterior capsule was exposed and an inverted T capsulotomy was made.  Fracture hematoma was encountered and evacuated. The patient was found to have a comminuted Left subcapital femoral neck fracture.  I freshened the femoral neck cut with a saw.  I removed the femoral neck fragment.  A corkscrew was placed into the head and the head  was removed.  This was passed to the back table and was measured. The pubofemoral ligament was released subperiosteally to the lesser trochanter.  Acetabular exposure was achieved, and the pulvinar and labrum were excised. Sequential reaming of the acetabulum was then performed up to a size 51 mm reamer under direct visulization. A 52 mm cup was then opened and impacted into place at approximately 40 degrees of abduction and 20 degrees of anteversion. The final polyethylene liner was impacted into place and acetabular osteophytes were removed.    I then gained femoral exposure taking care to protect the abductors and greater trochanter.  This was performed using standard external rotation, extension, and adduction.  A cookie cutter was used to enter the femoral canal, and then the femoral canal finder was placed.  Sequential broaching was performed up to a size 10.  Calcar planer was used on the femoral neck remnant.  I placed a high offset neck and a trial head ball.  The hip was reduced.  Leg lengths and offset were checked fluoroscopically.  The hip was dislocated and trial components were removed.  The final implants were placed, and the hip was reduced.  Fluoroscopy was used to confirm component position and leg lengths.  At 90 degrees of external rotation and full extension, the hip was stable to an anterior directed force.   The wound was copiously irrigated with Irrisept solution and normal saline using pule lavage.  Marcaine solution was injected into the periarticular soft tissue.  The wound was closed in layers using #1 Stratafix for the fascia, 2-0 Vicryl for the subcutaneous fat,  2-0 Monocryl for the deep dermal layer, and staples + Dermabond for the skin.  Once the glue was fully dried, an Aquacell Ag dressing was applied.  The patient was transported to the recovery room in stable condition.  Sponge, needle, and instrument counts were correct at the end of the case x2.  The patient tolerated the  procedure well and there were no known complications.  Please note that a surgical assistant was a medical necessity for this procedure to perform it in a safe and expeditious manner. Assistant was necessary to provide appropriate retraction of vital neurovascular structures, to prevent femoral fracture, and to allow for anatomic placement of the prosthesis.

## 2021-08-17 NOTE — Anesthesia Postprocedure Evaluation (Signed)
Anesthesia Post Note  Patient: Beth Pennington  Procedure(s) Performed: TOTAL HIP ARTHROPLASTY ANTERIOR APPROACH (Left: Hip)     Patient location during evaluation: PACU Anesthesia Type: Spinal Level of consciousness: responds to stimulation Pain management: pain level controlled Vital Signs Assessment: post-procedure vital signs reviewed and stable Respiratory status: spontaneous breathing, respiratory function stable and patient connected to nasal cannula oxygen Cardiovascular status: blood pressure returned to baseline and stable Postop Assessment: no headache, no backache, no apparent nausea or vomiting and spinal receding (Internal rotation of the legs) Anesthetic complications: no   No notable events documented.  Last Vitals:  Vitals:   08/17/21 1715 08/17/21 1730  BP: 104/75 (!) 110/58  Pulse: 66 75  Resp: 13 12  Temp:    SpO2: 100% 99%    Last Pain:  Vitals:   08/17/21 1630  TempSrc:   PainSc: Asleep                 Meriam Chojnowski,W. EDMOND

## 2021-08-17 NOTE — Transfer of Care (Signed)
Immediate Anesthesia Transfer of Care Note  Patient: Beth Pennington  Procedure(s) Performed: TOTAL HIP ARTHROPLASTY ANTERIOR APPROACH (Left: Hip)  Patient Location: PACU  Anesthesia Type:Spinal  Level of Consciousness: drowsy  Airway & Oxygen Therapy: Patient Spontanous Breathing and Patient connected to face mask oxygen  Post-op Assessment: Report given to RN and Post -op Vital signs reviewed and stable  Post vital signs: Reviewed and stable  Last Vitals:  Vitals Value Taken Time  BP 95/53 08/17/21 1523  Temp    Pulse 85 08/17/21 1524  Resp 12 08/17/21 1524  SpO2 100 % 08/17/21 1524  Vitals shown include unvalidated device data.  Last Pain:  Vitals:   08/17/21 1000  TempSrc:   PainSc: 0-No pain         Complications: No notable events documented.

## 2021-08-17 NOTE — Anesthesia Preprocedure Evaluation (Addendum)
Anesthesia Evaluation  Patient identified by MRN, date of birth, ID band Patient confused    Reviewed: Allergy & Precautions, H&P , NPO status , Patient's Chart, lab work & pertinent test results  Airway Mallampati: III  TM Distance: >3 FB Neck ROM: Full    Dental no notable dental hx. (+) Teeth Intact, Dental Advisory Given   Pulmonary neg pulmonary ROS, former smoker,    Pulmonary exam normal breath sounds clear to auscultation       Cardiovascular hypertension, Pt. on medications  Rhythm:Regular Rate:Normal     Neuro/Psych Dementia negative neurological ROS     GI/Hepatic negative GI ROS, Neg liver ROS,   Endo/Other  diabetes, Type 2, Oral Hypoglycemic Agents  Renal/GU negative Renal ROS  negative genitourinary   Musculoskeletal   Abdominal   Peds  Hematology negative hematology ROS (+)   Anesthesia Other Findings   Reproductive/Obstetrics negative OB ROS                            Anesthesia Physical Anesthesia Plan  ASA: 3  Anesthesia Plan: Spinal   Post-op Pain Management: Ofirmev IV (intra-op)*   Induction: Intravenous  PONV Risk Score and Plan: 2 and Propofol infusion and Treatment may vary due to age or medical condition  Airway Management Planned: Natural Airway and Simple Face Mask  Additional Equipment:   Intra-op Plan:   Post-operative Plan:   Informed Consent: I have reviewed the patients History and Physical, chart, labs and discussed the procedure including the risks, benefits and alternatives for the proposed anesthesia with the patient or authorized representative who has indicated his/her understanding and acceptance.   Patient has DNR.  Discussed DNR with power of attorney and Suspend DNR.   Dental advisory given  Plan Discussed with: CRNA  Anesthesia Plan Comments:        Anesthesia Quick Evaluation

## 2021-08-17 NOTE — Discharge Instructions (Signed)
? ?Dr. [name] Swinteck ?Joint Replacement Specialist ?[facility] ?[address] ?[place], [place] [address] ?[phone] ? ? ?TOTAL HIP REPLACEMENT POSTOPERATIVE DIRECTIONS ? ? ? ?Hip Rehabilitation, Guidelines Following Surgery  ? ?WEIGHT BEARING ?Weight bearing as tolerated with assist device (walker, cane, etc) as directed, use it as long as suggested by your surgeon or therapist, typically at least 4-6 weeks. ? ?The results of a hip operation are greatly improved after range of motion and muscle strengthening exercises. Follow all safety measures which are given to protect your hip. If any of these exercises cause increased pain or swelling in your joint, decrease the amount until you are comfortable again. Then slowly increase the exercises. Call your caregiver if you have problems or questions.  ? ?HOME CARE INSTRUCTIONS  ?Most of the following instructions are designed to prevent the dislocation of your new hip.  ?Remove items at home which could result in a fall. This includes throw rugs or furniture in walking pathways.  ?Continue medications as instructed at time of discharge. ?You may have some home medications which will be placed on hold until you complete the course of blood thinner medication. ?You may start showering once you are discharged home. Do not remove your dressing. ?Do not put on socks or shoes without following the instructions of your caregivers.   ?Sit on chairs with arms. Use the chair arms to help push yourself up when arising.  ?Arrange for the use of a toilet seat elevator so you are not sitting low.  ?Walk with walker as instructed.  ?You may resume a sexual relationship in one month or when given the OK by your caregiver.  ?Use walker as long as suggested by your caregivers.  ?You may put full weight on your legs and walk as much as is comfortable. ?Avoid periods of inactivity such as sitting longer than an hour when not asleep. This helps prevent blood  clots.  ?You may return to work once you are cleared by your surgeon.  ?Do not drive a car for 6 weeks or until released by your surgeon.  ?Do not drive while taking narcotics.  ?Wear elastic stockings for two weeks following surgery during the day but you may remove then at night.  ?Make sure you keep all of your appointments after your operation with all of your doctors and caregivers. You should call the office at the above phone number and make an appointment for approximately two weeks after the date of your surgery. ?Please pick up a stool softener and laxative for home use as long as you are requiring pain medications. ?ICE to the affected hip every three hours for 30 minutes at a time and then as needed for pain and swelling. Continue to use ice on the hip for pain and swelling from surgery. You may notice swelling that will progress down to the foot and ankle.  This is normal after surgery.  Elevate the leg when you are not up walking on it.   ?It is important for you to complete the blood thinner medication as prescribed by your doctor. ?Continue to use the breathing machine which will help keep your temperature down.  It is common for your temperature to cycle up and down following surgery, especially at night when you are not up moving around and exerting yourself.  The breathing machine keeps your lungs expanded and your temperature down. ? ?RANGE OF MOTION AND STRENGTHENING EXERCISES  ?These exercises are designed to help you   keep full movement of your hip joint. Follow your caregiver's or physical therapist's instructions. Perform all exercises about fifteen times, three times per day or as directed. Exercise both hips, even if you have had only one joint replacement. These exercises can be done on a training (exercise) mat, on the floor, on a table or on a bed. Use whatever works the best and is most comfortable for you. Use music or television while you are exercising so that the exercises are a  pleasant break in your day. This will make your life better with the exercises acting as a break in routine you can look forward to.  ?Lying on your back, slowly slide your foot toward your buttocks, raising your knee up off the floor. Then slowly slide your foot back down until your leg is straight again.  ?Lying on your back spread your legs as far apart as you can without causing discomfort.  ?Lying on your side, raise your upper leg and foot straight up from the floor as far as is comfortable. Slowly lower the leg and repeat.  ?Lying on your back, tighten up the muscle in the front of your thigh (quadriceps muscles). You can do this by keeping your leg straight and trying to raise your heel off the floor. This helps strengthen the largest muscle supporting your knee.  ?Lying on your back, tighten up the muscles of your buttocks both with the legs straight and with the knee bent at a comfortable angle while keeping your heel on the floor.  ? ?SKILLED REHAB INSTRUCTIONS: ?If the patient is transferred to a skilled rehab facility following release from the hospital, a list of the current medications will be sent to the facility for the patient to continue.  When discharged from the skilled rehab facility, please have the facility set up the patient's Home Health Physical Therapy prior to being released. Also, the skilled facility will be responsible for providing the patient with their medications at time of release from the facility to include their pain medication and their blood thinner medication. If the patient is still at the rehab facility at time of the two week follow up appointment, the skilled rehab facility will also need to assist the patient in arranging follow up appointment in our office and any transportation needs. ? ?POST-OPERATIVE OPIOID TAPER INSTRUCTIONS: ?It is important to wean off of your opioid medication as soon as possible. If you do not need pain medication after your surgery it is ok  to stop day one. ?Opioids include: ?Codeine, Hydrocodone(Norco, Vicodin), Oxycodone(Percocet, oxycontin) and hydromorphone amongst others.  ?Long term and even short term use of opiods can cause: ?Increased pain response ?Dependence ?Constipation ?Depression ?Respiratory depression ?And more.  ?Withdrawal symptoms can include ?Flu like symptoms ?Nausea, vomiting ?And more ?Techniques to manage these symptoms ?Hydrate well ?Eat regular healthy meals ?Stay active ?Use relaxation techniques(deep breathing, meditating, yoga) ?Do Not substitute Alcohol to help with tapering ?If you have been on opioids for less than two weeks and do not have pain than it is ok to stop all together.  ?Plan to wean off of opioids ?This plan should start within one week post op of your joint replacement. ?Maintain the same interval or time between taking each dose and first decrease the dose.  ?Cut the total daily intake of opioids by one tablet each day ?Next start to increase the time between doses. ?The last dose that should be eliminated is the evening dose.  ? ? ?MAKE   SURE YOU:  ?Understand these instructions.  ?Will watch your condition.  ?Will get help right away if you are not doing well or get worse. ? ?Pick up stool softner and laxative for home use following surgery while on pain medications. ?Do not remove your dressing. ?The dressing is waterproof--it is OK to take showers. ?Continue to use ice for pain and swelling after surgery. ?Do not use any lotions or creams on the incision until instructed by your surgeon. ?Total Hip Protocol. ? ?

## 2021-08-18 ENCOUNTER — Encounter (HOSPITAL_COMMUNITY): Payer: Self-pay | Admitting: Orthopedic Surgery

## 2021-08-18 DIAGNOSIS — F03918 Unspecified dementia, unspecified severity, with other behavioral disturbance: Secondary | ICD-10-CM | POA: Diagnosis not present

## 2021-08-18 DIAGNOSIS — E1169 Type 2 diabetes mellitus with other specified complication: Secondary | ICD-10-CM | POA: Diagnosis not present

## 2021-08-18 DIAGNOSIS — Z96642 Presence of left artificial hip joint: Secondary | ICD-10-CM

## 2021-08-18 DIAGNOSIS — S72002A Fracture of unspecified part of neck of left femur, initial encounter for closed fracture: Secondary | ICD-10-CM | POA: Diagnosis not present

## 2021-08-18 LAB — CBC
HCT: 30.7 % — ABNORMAL LOW (ref 36.0–46.0)
Hemoglobin: 10.7 g/dL — ABNORMAL LOW (ref 12.0–15.0)
MCH: 32.3 pg (ref 26.0–34.0)
MCHC: 34.9 g/dL (ref 30.0–36.0)
MCV: 92.7 fL (ref 80.0–100.0)
Platelets: 198 10*3/uL (ref 150–400)
RBC: 3.31 MIL/uL — ABNORMAL LOW (ref 3.87–5.11)
RDW: 13.3 % (ref 11.5–15.5)
WBC: 10 10*3/uL (ref 4.0–10.5)
nRBC: 0 % (ref 0.0–0.2)

## 2021-08-18 LAB — GLUCOSE, CAPILLARY
Glucose-Capillary: 119 mg/dL — ABNORMAL HIGH (ref 70–99)
Glucose-Capillary: 169 mg/dL — ABNORMAL HIGH (ref 70–99)
Glucose-Capillary: 123 mg/dL — ABNORMAL HIGH (ref 70–99)
Glucose-Capillary: 155 mg/dL — ABNORMAL HIGH (ref 70–99)

## 2021-08-18 MED ORDER — SENNA 8.6 MG PO TABS
1.0000 | ORAL_TABLET | Freq: Two times a day (BID) | ORAL | Status: DC
Start: 2021-08-18 — End: 2021-08-22
  Administered 2021-08-18 – 2021-08-21 (×6): 8.6 mg via ORAL
  Filled 2021-08-18 (×7): qty 1

## 2021-08-18 MED ORDER — ONDANSETRON HCL 4 MG/2ML IJ SOLN: 4.0000 mg | Freq: Four times a day (QID) | INTRAMUSCULAR | Status: DC | PRN

## 2021-08-18 MED ORDER — HYDROCODONE-ACETAMINOPHEN 7.5-325 MG PO TABS: 1.0000 | ORAL_TABLET | ORAL | Status: DC | PRN

## 2021-08-18 MED ORDER — HYDROCODONE-ACETAMINOPHEN 10-325 MG PO TABS: 0.5000 | ORAL_TABLET | ORAL | 0 refills | Status: AC | PRN

## 2021-08-18 MED ORDER — DIPHENHYDRAMINE HCL 12.5 MG/5ML PO ELIX: 12.5000 mg | ORAL_SOLUTION | ORAL | Status: DC | PRN

## 2021-08-18 MED ORDER — MORPHINE SULFATE (PF) 2 MG/ML IV SOLN: 0.5000 mg | INTRAVENOUS | Status: DC | PRN

## 2021-08-18 MED ORDER — POLYETHYLENE GLYCOL 3350 17 G PO PACK: 17.0000 g | PACK | Freq: Every day | ORAL | Status: DC | PRN

## 2021-08-18 MED ORDER — HYDROCODONE-ACETAMINOPHEN 5-325 MG PO TABS: 1.0000 | ORAL_TABLET | ORAL | Status: DC | PRN

## 2021-08-18 MED ORDER — METOCLOPRAMIDE HCL 5 MG PO TABS: 5.0000 mg | ORAL_TABLET | Freq: Three times a day (TID) | ORAL | Status: DC | PRN

## 2021-08-18 MED ORDER — ACETAMINOPHEN 500 MG PO TABS
500.0000 mg | ORAL_TABLET | Freq: Four times a day (QID) | ORAL | Status: AC
  Administered 2021-08-18 – 2021-08-19 (×4): 500 mg via ORAL
  Filled 2021-08-18 (×4): qty 1

## 2021-08-18 MED ORDER — METHOCARBAMOL 500 MG PO TABS
500.0000 mg | ORAL_TABLET | Freq: Four times a day (QID) | ORAL | Status: DC | PRN
  Administered 2021-08-20: 500 mg via ORAL
  Filled 2021-08-18: qty 1

## 2021-08-18 MED ORDER — METOCLOPRAMIDE HCL 5 MG/ML IJ SOLN: 5.0000 mg | Freq: Three times a day (TID) | INTRAMUSCULAR | Status: DC | PRN

## 2021-08-18 MED ORDER — DOCUSATE SODIUM 100 MG PO CAPS
100.0000 mg | ORAL_CAPSULE | Freq: Two times a day (BID) | ORAL | Status: DC
  Administered 2021-08-18 – 2021-08-21 (×6): 100 mg via ORAL
  Filled 2021-08-18 (×7): qty 1

## 2021-08-18 MED ORDER — ASPIRIN 81 MG PO CHEW
81.0000 mg | CHEWABLE_TABLET | Freq: Two times a day (BID) | ORAL | Status: DC
  Administered 2021-08-18 – 2021-08-21 (×6): 81 mg via ORAL
  Filled 2021-08-18 (×8): qty 1

## 2021-08-18 MED ORDER — ASPIRIN 81 MG PO CHEW: 81.0000 mg | CHEWABLE_TABLET | Freq: Two times a day (BID) | ORAL | 0 refills | Status: AC

## 2021-08-18 MED ORDER — CALCIUM CARBONATE ANTACID 500 MG PO CHEW
2.0000 | CHEWABLE_TABLET | ORAL | Status: DC | PRN
Start: 2021-08-18 — End: 2021-08-22

## 2021-08-18 MED ORDER — ONDANSETRON HCL 4 MG PO TABS: 4.0000 mg | ORAL_TABLET | Freq: Four times a day (QID) | ORAL | Status: DC | PRN

## 2021-08-18 MED ORDER — SODIUM CHLORIDE 0.9 % IV SOLN
INTRAVENOUS | Status: DC
Start: 2021-08-18 — End: 2021-08-22

## 2021-08-18 MED ORDER — PHENOL 1.4 % MT LIQD: 1.0000 | OROMUCOSAL | Status: DC | PRN

## 2021-08-18 MED ORDER — METHOCARBAMOL 1000 MG/10ML IJ SOLN: 500.0000 mg | Freq: Four times a day (QID) | INTRAVENOUS | Status: DC | PRN

## 2021-08-18 MED ORDER — BISACODYL 10 MG RE SUPP
10.0000 mg | Freq: Every day | RECTAL | Status: DC | PRN
  Administered 2021-08-20: 10 mg via RECTAL
  Filled 2021-08-18: qty 1

## 2021-08-18 MED ORDER — MENTHOL 3 MG MT LOZG: 1.0000 | LOZENGE | OROMUCOSAL | Status: DC | PRN

## 2021-08-18 MED ORDER — ACETAMINOPHEN 325 MG PO TABS: 325.0000 mg | ORAL_TABLET | Freq: Four times a day (QID) | ORAL | Status: DC | PRN

## 2021-08-18 MED ORDER — CEFAZOLIN SODIUM-DEXTROSE 2-4 GM/100ML-% IV SOLN
2.0000 g | Freq: Four times a day (QID) | INTRAVENOUS | Status: AC
  Administered 2021-08-18 – 2021-08-19 (×2): 2 g via INTRAVENOUS
  Filled 2021-08-18 (×2): qty 100

## 2021-08-18 NOTE — Evaluation (Signed)
Physical Therapy Evaluation Patient Details Name: Beth Pennington MRN: [id number] DOB: [birth year] Today's Date: 08/18/2021  History of Present Illness  Pt s/p fall with L hip fx and now s/p L THR by anterior direct approach.  Pt with hx of DM and dementia  Clinical Impression  Pt admitted as above and presenting with functional mobility limitations 2* decreased L LE strength/ROM, post op pain, balance deficits and dementia related cognitive deficits.  Pt would benefit from follow up rehab at SNF level to maximize IND and safety.     Recommendations for follow up therapy are one component of a multi-disciplinary discharge planning process, led by the attending physician.  Recommendations may be updated based on patient status, additional functional criteria and insurance authorization.  Follow Up Recommendations Skilled nursing-short term rehab (<3 hours/day)    Assistance Recommended at Discharge Frequent or constant Supervision/Assistance  Patient can return home with the following  A little help with walking and/or transfers;A little help with bathing/dressing/bathroom;Assistance with cooking/housework;Assist for transportation;Help with stairs or ramp for entrance    Equipment Recommendations None recommended by PT  Recommendations for Other Services       Functional Status Assessment Patient has had a recent decline in their functional status and demonstrates the ability to make significant improvements in function in a reasonable and predictable amount of time.     Precautions / Restrictions Precautions Precautions: Fall Restrictions Weight Bearing Restrictions: No LLE Weight Bearing: Weight bearing as tolerated      Mobility  Bed Mobility Overal bed mobility: Needs Assistance Bed Mobility: Supine to Sit     Supine to sit: Mod assist     General bed mobility comments: multi modal cues with assist to manage LEs, control trunk and complete rotation to EOB sitting using  pad    Transfers Overall transfer level: Needs assistance Equipment used: Rolling walker (2 wheels) Transfers: Sit to/from Stand Sit to Stand: Min assist, +2 physical assistance, +2 safety/equipment, Mod assist           General transfer comment: cues for use of UEs to self assist; physical assist to bring wt up and fwd and to balance in standing with RW    Ambulation/Gait Ambulation/Gait assistance: Min assist, +2 physical assistance, +2 safety/equipment Gait Distance (Feet): 27 Feet Assistive device: Rolling walker (2 wheels) Gait Pattern/deviations: Step-to pattern, Step-through pattern, Decreased step length - right, Decreased step length - left, Shuffle, Trunk flexed       General Gait Details: cues for posture and position from RW with physical assist for balance/support and RW management  Stairs            Wheelchair Mobility    Modified Rankin (Stroke Patients Only)       Balance Overall balance assessment: Needs assistance Sitting-balance support: No upper extremity supported, Feet supported Sitting balance-[name] Scale: Fair     Standing balance support: Bilateral upper extremity supported Standing balance-[name] Scale: Poor                               Pertinent Vitals/Pain Pain Assessment Pain Assessment: Faces Faces Pain Scale: Hurts a little bit Pain Location: L hip with activity Pain Descriptors / Indicators: Grimacing Pain Intervention(s): Limited activity within patient's tolerance, Monitored during session, Ice applied    Home Living Family/patient expects to be discharged to:: Skilled nursing facility  Prior Function Prior Level of Function : Patient poor historian/Family not available                     Hand Dominance        Extremity/Trunk Assessment   Upper Extremity Assessment Upper Extremity Assessment: Overall WFL for tasks assessed    Lower Extremity  Assessment Lower Extremity Assessment: LLE deficits/detail LLE Deficits / Details: ROM WFL with decreased strength       Communication   Communication: Other (comment) (Pt rambling and often unintelligible)  Cognition Arousal/Alertness: Awake/alert Behavior During Therapy: Impulsive, Restless Overall Cognitive Status: History of cognitive impairments - at baseline                                          General Comments      Exercises     Assessment/Plan    PT Assessment Patient needs continued PT services  PT Problem List Decreased strength;Decreased range of motion;Decreased activity tolerance;Decreased balance;Decreased mobility;Decreased knowledge of use of DME;Decreased cognition;Pain       PT Treatment Interventions DME instruction;Gait training;Stair training;Functional mobility training;Therapeutic activities;Therapeutic exercise;Balance training;Patient/family education    PT Goals (Current goals can be found in the Care Plan section)  Acute Rehab PT Goals Patient Stated Goal: NO goals stated PT Goal Formulation: With patient Time For Goal Achievement: 09/01/21 Potential to Achieve Goals: Good    Frequency Min 3X/week     Co-evaluation               AM-PAC PT "6 Clicks" Mobility  Outcome Measure Help needed turning from your back to your side while in a flat bed without using bedrails?: A Lot Help needed moving from lying on your back to sitting on the side of a flat bed without using bedrails?: A Lot Help needed moving to and from a bed to a chair (including a wheelchair)?: A Lot Help needed standing up from a chair using your arms (e.g., wheelchair or bedside chair)?: A Lot Help needed to walk in hospital room?: A Lot Help needed climbing 3-5 steps with a railing? : Total 6 Click Score: 11    End of Session Equipment Utilized During Treatment: Gait belt Activity Tolerance: Patient tolerated treatment well Patient left: in  chair;with call bell/phone within reach;with chair alarm set Nurse Communication: Mobility status PT Visit Diagnosis: Unsteadiness on feet (R26.81)    Time: [id number] PT Time Calculation (min) (ACUTE ONLY): 19 min   Charges:   PT Evaluation $PT Eval Low Complexity: 1 Low          Beth Pennington Pager [phone] Office [phone]   Beth Pennington 08/18/2021, 1:56 PM

## 2021-08-18 NOTE — Progress Notes (Signed)
    Subjective:  Patient reports pain as mild.  Denies N/V/CP/SOB/Abd pain.  Patient is a poor historian due to degree of  Alzheimer's. Patient is more alert today than yesterday. She was able to tell us her name and the month and day of her birthday. Patient's daughter-in-law was pleasantly surprised by this. She doesn't report any pain.  Patients daughter-in-law at bedside and spoke to patients son over the phone. All questions solicited and answered.   Objective:   VITALS:   Vitals:   08/17/21 2142 08/18/21 0141 08/18/21 0634 08/18/21 1000  BP: (!) 162/90 (!) 154/79 (!) 156/88 (!) 143/98  Pulse: 71 60 66 77  Resp: 18 18 17 16   Temp: (!) 97.5 F (36.4 C)  (!) 97.4 F (36.3 C) 98.1 F (36.7 C)  TempSrc: Oral  Oral Oral  SpO2: 100% 100% 100% 100%  Weight:      Height:        Patient lying in bed with daughter-in-law at bedside. NAD. She is much more alert and able to answer some questions today vs yesterday. She is alert and oriented only to name and month and day of her birthday. Otherwise not oriented. Patient somewhat able to follow some commands.  ABD soft Neurovascular intact Sensation intact distally Intact pulses distally Dorsiflexion/Plantar flexion intact Incision: dressing C/D/I No cellulitis present Compartment soft Painless log rolling of the hip.   Lab Results  Component Value Date   WBC 10.0 08/18/2021   HGB 10.7 (L) 08/18/2021   HCT 30.7 (L) 08/18/2021   MCV 92.7 08/18/2021   PLT 198 08/18/2021   BMET    Component Value Date/Time   NA 139 08/16/2021 1558   K 4.4 08/16/2021 1558   CL 103 08/16/2021 1558   CO2 28 08/16/2021 1558   GLUCOSE 113 (H) 08/16/2021 1558   BUN 16 08/16/2021 1558   CREATININE 1.00 08/16/2021 1558   CALCIUM 9.2 08/16/2021 1558   GFRNONAA 57 (L) 08/16/2021 1558     Assessment/Plan: 1 Day Post-Op   Principal Problem:   Closed left hip fracture, initial encounter (HCC) Active Problems:   DM (diabetes mellitus), type 2  (HCC)   Dementia with behavioral disturbance ([place])   WBAT with walker DVT ppx: Aspirin, SCDs, TEDS PO pain control PT/OT: Patient to ambulate with PT.  Dispo: Patient under hospitalist care. SNF vs back to her memory care unit dependent on progression with PT. Pain medication and DVT ppx printed in chart.     Charlott Rakes, PA-C 08/18/2021, 10:40 AM  [facility]  [facility] [address], [place], [place] [address] Phone: [phone] [email or web address] Facebook  [employer]

## 2021-08-18 NOTE — Plan of Care (Signed)

## 2021-08-18 NOTE — TOC Initial Note (Signed)
Transition of Care [facility]) - Initial/Assessment Note   Patient Details  Name: Beth Pennington MRN: [id number] Date of Birth: 1940-11-09  Transition of Care [facility]) CM/SW Contact:    Ewing Schlein, LCSW Phone Number: 08/18/2021, 2:20 PM  Clinical Narrative: PT evaluation recommended SNF. CSW spoke with patient's son, Yaretzy Olazabal, as patient has dementia and is oriented x2. Son agreeable to SNF referral. FL2 mostly completed, but CSW unable to obtain PASRR as patient does not have a SSN listed in the system and, per son, the card is at home. FL2 will be completed and PASRR requested once TOC is provided SSN.  Expected Discharge Plan: Skilled Nursing Facility Barriers to Discharge: Continued Medical Work up, SNF Pending bed offer, Awaiting State Approval (PASRR)  Patient Goals and CMS Choice Patient states their goals for this hospitalization and ongoing recovery are:: Go to short-term rehab before returning home CMS Medicare.gov Compare Post Acute Care list provided to:: Patient Represenative (must comment) Choice offered to / list presented to : Adult Children  Expected Discharge Plan and Services Expected Discharge Plan: Skilled Nursing Facility In-house Referral: Clinical Social Work Post Acute Care Choice: Skilled Nursing Facility Living arrangements for the past 2 months: Assisted Living Facility           DME Arranged: N/A DME Agency: NA  Prior Living Arrangements/Services Living arrangements for the past 2 months: Assisted Living Facility Lives with:: Facility Resident Patient language and need for interpreter reviewed:: Yes Do you feel safe going back to the place where you live?: Yes      Need for Family Participation in Patient Care: Yes (Comment) (Patient has dementia.) Care giver support system in place?: Yes (comment) Criminal Activity/Legal Involvement Pertinent to Current Situation/Hospitalization: No - Comment as needed  Activities of Daily Living Home Assistive  Devices/Equipment: Wheelchair ADL Screening (condition at time of admission) Patient's cognitive ability adequate to safely complete daily activities?: No Is the patient deaf or have difficulty hearing?: Yes Does the patient have difficulty seeing, even when wearing glasses/contacts?: No Does the patient have difficulty concentrating, remembering, or making decisions?: Yes Patient able to express need for assistance with ADLs?: Yes Does the patient have difficulty dressing or bathing?: Yes Independently performs ADLs?: No Communication: Needs assistance Is this a change from baseline?: Pre-admission baseline Dressing (OT): Needs assistance Is this a change from baseline?: Pre-admission baseline Grooming: Needs assistance Is this a change from baseline?: Pre-admission baseline Feeding: Needs assistance Is this a change from baseline?: Pre-admission baseline Bathing: Needs assistance Is this a change from baseline?: Pre-admission baseline Toileting: Needs assistance Is this a change from baseline?: Change from baseline, expected to last <3 days In/Out Bed: Needs assistance Is this a change from baseline?: Change from baseline, expected to last <3 days Walks in Home: Needs assistance Is this a change from baseline?: Pre-admission baseline Does the patient have difficulty walking or climbing stairs?: Yes Weakness of Legs: Both Weakness of Arms/Hands: None  Permission Sought/Granted Permission sought to share information with : Oceanographer granted to share information with : Yes, Verbal Permission Granted Permission granted to share info w AGENCY: SNFs  Emotional Assessment Orientation: : Oriented to Self, Oriented to Place Alcohol / Substance Use: Not Applicable  Admission diagnosis:  Closed left hip fracture (HCC) [S72.002A] Closed fracture of neck of left femur, initial encounter (HCC) [S72.002A] Closed left hip fracture, initial encounter (HCC)  [S72.002A] Patient Active Problem List   Diagnosis Date Noted   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

## 2021-08-18 NOTE — Progress Notes (Signed)
[facility] Progress Note  Patient: Beth Pennington     [id number]  DOA: 08/15/2021   PCP: System, Provider Not In       Brief hospital course: This is an 81 year old female with Alzheimer's dementia, diabetes mellitus type 2, hypertension who lives in assisted living/memory care and presented to the hospital after a fall and left hip pain. Found to have a left hip fracture in the ED. Underwent total left hip arthroplasty.  Subjective:  Alert this morning.  Her daughter states that she is eating quite well.  She was waiting on physical therapy at this morning  Assessment and Plan: Principal Problem:   Closed left hip fracture, initial encounter ([place]) -Management per orthopedic surgery -Aspirin for DVT prophylaxis along with teds and SCDs - Per PT, she will need SNF-I have contacted TOC  Active Problems:  Acute blood loss anemia, normocytic - Hemoglobin has dropped from 12.7-10.7 - No need to transfuse as of yet    DM (diabetes mellitus), type 2 ([place]) -Continue to hold metformin and treat with NovoLog sliding scale    Dementia with behavioral disturbance (HCC) -Continue Depakote, Haldol, olanzapine, Namenda and Prozac which have been confirmed with the patient's family  Hypertension - Holding losartan as acute blood loss from a fracture can result in AKI - Have ordered labetalol as needed  Hypothyroidism - Continue levothyroxine  Body mass index is 21.29 kg/m.   DVT prophylaxis: Aspirin and teds     Code Status: DNR  Consultants: Orthopedic surgery Level of Care: Level of care: Med-Surg Disposition Plan:  Status is: Inpatient Remains inpatient appropriate because: Awaiting skilled nursing facility Objective:   Vitals:   08/18/21 0141 08/18/21 0634 08/18/21 1000 08/18/21 1128  BP: (!) 154/79 (!) 156/88 (!) 143/98 (!) 145/82  Pulse: 60 66 77 85  Resp: 18 17 16 16   Temp:  (!) 97.4 F (36.3 C) 98.1 F (36.7 C) 98.7 F (37.1 C)  TempSrc:  Oral Oral    SpO2: 100% 100% 100% 97%  Weight:      Height:       Filed Weights   08/16/21 1733 08/17/21 1246  Weight: 52.8 kg 52.8 kg   Exam: General exam: Appears comfortable  HEENT: PERRLA, oral mucosa moist, no sclera icterus or thrush Respiratory system: Clear to auscultation. Respiratory effort normal. Cardiovascular system: S1 & S2 heard, regular rate and rhythm Gastrointestinal system: Abdomen soft, non-tender, nondistended. Normal bowel sounds   Central nervous system: Alert and oriented x 1- No focal neurological deficits. Extremities: No cyanosis, clubbing or edema Skin: No rashes or ulcers Psychiatry:  Mood & affect appropriate.    Imaging and lab data was personally reviewed    CBC: Recent Labs  Lab 08/15/21 2010 08/16/21 1558 08/17/21 0750 08/18/21 0640  WBC 9.1 7.3 10.1 10.0  NEUTROABS 7.4 4.4  --   --   HGB 12.4 12.2 12.7 10.7*  HCT 36.6 36.3 38.4 30.7*  MCV 92.0 95.3 95.5 92.7  PLT 203 193 197 [id number]   Basic Metabolic Panel: Recent Labs  Lab 08/15/21 2010 08/16/21 1558  NA 134* 139  K 4.5 4.4  CL 95* 103  CO2 24 28  GLUCOSE 120* 113*  BUN 20 16  CREATININE 0.92 1.00  CALCIUM 9.7 9.2   GFR: Estimated Creatinine Clearance: 35.5 mL/min (by C-G formula based on SCr of 1 mg/dL).  Scheduled Meds:  atorvastatin  40 mg Oral Daily   divalproex  250 mg Oral BID   FLUoxetine  5 mg Oral Daily   haloperidol  0.5 mg Oral BID   insulin aspart  0-15 Units Subcutaneous TID WC   levothyroxine  75 mcg Oral Daily   memantine  5 mg Oral Daily   OLANZapine  5 mg Oral QHS   potassium chloride SA  20 mEq Oral Daily   Continuous Infusions:   LOS: 2 days   Author: Debbe Odea  08/18/2021 2:04 PM

## 2021-08-19 DIAGNOSIS — S72002A Fracture of unspecified part of neck of left femur, initial encounter for closed fracture: Secondary | ICD-10-CM | POA: Diagnosis not present

## 2021-08-19 DIAGNOSIS — E1169 Type 2 diabetes mellitus with other specified complication: Secondary | ICD-10-CM | POA: Diagnosis not present

## 2021-08-19 DIAGNOSIS — F03918 Unspecified dementia, unspecified severity, with other behavioral disturbance: Secondary | ICD-10-CM | POA: Diagnosis not present

## 2021-08-19 DIAGNOSIS — Z96642 Presence of left artificial hip joint: Secondary | ICD-10-CM | POA: Diagnosis not present

## 2021-08-19 LAB — CBC
HCT: 27.6 % — ABNORMAL LOW (ref 36.0–46.0)
Hemoglobin: 9.4 g/dL — ABNORMAL LOW (ref 12.0–15.0)
MCH: 31.8 pg (ref 26.0–34.0)
MCHC: 34.1 g/dL (ref 30.0–36.0)
MCV: 93.2 fL (ref 80.0–100.0)
Platelets: 236 10*3/uL (ref 150–400)
RBC: 2.96 MIL/uL — ABNORMAL LOW (ref 3.87–5.11)
RDW: 13.6 % (ref 11.5–15.5)
WBC: 7.3 10*3/uL (ref 4.0–10.5)
nRBC: 0 % (ref 0.0–0.2)

## 2021-08-19 LAB — GLUCOSE, CAPILLARY
Glucose-Capillary: 159 mg/dL — ABNORMAL HIGH (ref 70–99)
Glucose-Capillary: 137 mg/dL — ABNORMAL HIGH (ref 70–99)
Glucose-Capillary: 143 mg/dL — ABNORMAL HIGH (ref 70–99)
Glucose-Capillary: 147 mg/dL — ABNORMAL HIGH (ref 70–99)
Glucose-Capillary: 158 mg/dL — ABNORMAL HIGH (ref 70–99)

## 2021-08-19 MED ORDER — POLYETHYLENE GLYCOL 3350 17 G PO PACK
17.0000 g | PACK | Freq: Every day | ORAL | Status: DC
  Administered 2021-08-19 – 2021-08-22 (×4): 17 g via ORAL
  Filled 2021-08-19 (×4): qty 1

## 2021-08-19 NOTE — [place] FL2 (Addendum)
[place] MEDICAID FL2 LEVEL OF CARE SCREENING TOOL     IDENTIFICATION  Patient Name: Beth Pennington Birthdate: Jun 24, 1940 Sex: female Admission Date (Current Location): 08/15/2021  [facility] and [place] Number:  Producer, television/film/video and Address:  [facility],  [address] [place], [place] [address]      Provider Number: [id number]  Attending Physician Name and Address:  Calvert Cantor, MD  Relative Name and Phone Number:  Marykate Heuberger (son) Ph: [phone]    Current Level of Care: Hospital Recommended Level of Care: Skilled Nursing Facility Prior Approval Number:    Date Approved/Denied:   PASRR Number: [id number]  Discharge Plan: SNF    Current Diagnoses: Patient Active Problem List   Diagnosis Date Noted   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   DM (diabetes mellitus), type 2 (HCC) 08/16/2021   Dementia with behavioral disturbance (HCC) 08/16/2021   Closed left hip fracture, initial encounter (HCC) 08/15/2021    Orientation RESPIRATION BLADDER Height & Weight     Self, Place  Normal Continent, Indwelling catheter Weight: 116 lb 6.5 oz (52.8 kg) Height:  5\' 2"  (157.5 cm)  BEHAVIORAL SYMPTOMS/MOOD NEUROLOGICAL BOWEL NUTRITION STATUS   (N/A)  (N/A) Continent Diet (Soft diet)  AMBULATORY STATUS COMMUNICATION OF NEEDS Skin   Limited Assist Verbally Surgical wounds, Skin abrasions, Other (Comment) (Abrasion: bilateral arms & legs; Ecchymosis: bilateral ribs)                       Personal Care Assistance Level of Assistance  Bathing, Feeding, Dressing Bathing Assistance: Limited assistance Feeding assistance: Independent Dressing Assistance: Limited assistance     Functional Limitations Info  Sight, Hearing, Speech Sight Info: Impaired Hearing Info: Impaired Speech Info: Adequate    SPECIAL CARE FACTORS FREQUENCY  PT (By licensed PT), OT (By licensed OT)     PT Frequency: 5x's/week OT Frequency: 5x's/week            Contractures  Contractures Info: Not present    Additional Factors Info  Code Status, Allergies, Psychotropic, Insulin Sliding Scale Code Status Info: DNR Allergies Info: NKA Psychotropic Info: Prozac, Haldol, Zyprexa Insulin Sliding Scale Info: See discharge summary       Current Medications (08/19/2021):  This is the current hospital active medication list Current Facility-Administered Medications  Medication Dose Route Frequency Provider Last Rate Last Admin   0.9 %  sodium chloride infusion   Intravenous Continuous 10/19/2021, PA-C 75 mL/hr at 08/18/21 1820 New Bag at 08/18/21 1820   acetaminophen (TYLENOL) tablet 650 mg  650 mg Oral Q6H PRN 10/18/21, MD   650 mg at 08/18/21 10/18/21   Or   acetaminophen (TYLENOL) suppository 650 mg  650 mg Rectal Q6H PRN 6979, MD       acetaminophen (TYLENOL) tablet 325-650 mg  325-650 mg Oral Q6H PRN Hill, Avery S, PA-C       acetaminophen (TYLENOL) tablet 500 mg  500 mg Oral Q6H Hill, 09-28-1977, PA-C   500 mg at 08/19/21 10/19/21   aspirin chewable tablet 81 mg  81 mg Oral BID 4801, PA-C   81 mg at 08/19/21 0839   atorvastatin (LIPITOR) tablet 40 mg  40 mg Oral Daily 10/19/21, MD   40 mg at 08/19/21 0839   bisacodyl (DULCOLAX) suppository 10 mg  10 mg Rectal Daily PRN 10/19/21 S, PA-C       calcium carbonate (TUMS - dosed in mg elemental  calcium) chewable tablet 400 mg of elemental calcium  2 tablet Oral Q4H PRN Hill, Alain Honey, PA-C       diphenhydrAMINE (BENADRYL) 12.5 MG/5ML elixir 12.5-25 mg  12.5-25 mg Oral Q4H PRN Hill, Denny Peon S, PA-C       divalproex (DEPAKOTE) DR tablet 250 mg  250 mg Oral BID Calvert Cantor, MD   250 mg at 08/19/21 0925   docusate sodium (COLACE) capsule 100 mg  100 mg Oral BID Clint Bolder S, PA-C   100 mg at 08/19/21 0840   FLUoxetine (PROZAC) 20 MG/5ML solution 5 mg  5 mg Oral Daily Calvert Cantor, MD   5 mg at 08/19/21 4132   haloperidol (HALDOL) tablet 0.5 mg  0.5 mg Oral BID Calvert Cantor, MD   0.5 mg at 08/19/21  0840   HYDROcodone-acetaminophen (NORCO) 7.5-325 MG per tablet 1-2 tablet  1-2 tablet Oral Q4H PRN Clois Dupes, PA-C       HYDROcodone-acetaminophen (NORCO/VICODIN) 5-325 MG per tablet 1-2 tablet  1-2 tablet Oral Q4H PRN Hill, Avery S, PA-C       insulin aspart (novoLOG) injection 0-15 Units  0-15 Units Subcutaneous TID WC Calvert Cantor, MD   3 Units at 08/19/21 4401   labetalol (NORMODYNE) injection 10 mg  10 mg Intravenous Q2H PRN Calvert Cantor, MD       levothyroxine (SYNTHROID) tablet 75 mcg  75 mcg Oral Daily Calvert Cantor, MD   75 mcg at 08/19/21 0626   memantine (NAMENDA) tablet 5 mg  5 mg Oral Daily Calvert Cantor, MD   5 mg at 08/19/21 0272   menthol-cetylpyridinium (CEPACOL) lozenge 3 mg  1 lozenge Oral PRN Clois Dupes, PA-C       Or   phenol (CHLORASEPTIC) mouth spray 1 spray  1 spray Mouth/Throat PRN Hill, Alain Honey, PA-C       methocarbamol (ROBAXIN) tablet 500 mg  500 mg Oral Q6H PRN Hill, Avery S, PA-C       Or   methocarbamol (ROBAXIN) 500 mg in dextrose 5 % 50 mL IVPB  500 mg Intravenous Q6H PRN Hill, Avery S, PA-C       metoCLOPramide (REGLAN) tablet 5-10 mg  5-10 mg Oral Q8H PRN Hill, Avery S, PA-C       Or   metoCLOPramide (REGLAN) injection 5-10 mg  5-10 mg Intravenous Q8H PRN Hill, Avery S, PA-C       morphine (PF) 2 MG/ML injection 0.5-1 mg  0.5-1 mg Intravenous Q2H PRN Hill, Avery S, PA-C       morphine (PF) 2 MG/ML injection 2 mg  2 mg Intravenous Q4H PRN Calvert Cantor, MD       OLANZapine (ZYPREXA) tablet 5 mg  5 mg Oral QHS Rizwan, Ladell Heads, MD   5 mg at 08/18/21 2314   ondansetron (ZOFRAN) tablet 4 mg  4 mg Oral Q6H PRN Calvert Cantor, MD       Or   ondansetron (ZOFRAN) injection 4 mg  4 mg Intravenous Q6H PRN Calvert Cantor, MD       ondansetron (ZOFRAN) tablet 4 mg  4 mg Oral Q6H PRN Hill, Avery S, PA-C       Or   ondansetron (ZOFRAN) injection 4 mg  4 mg Intravenous Q6H PRN Hill, Avery S, PA-C       polyethylene glycol (MIRALAX / GLYCOLAX) packet 17 g  17 g Oral  Daily PRN Hill, Avery S, PA-C       potassium chloride SA (  KLOR-CON M) CR tablet 20 mEq  20 mEq Oral Daily Calvert Cantorizwan, Saima, MD   20 mEq at 08/19/21 0839   senna (SENOKOT) tablet 8.6 mg  1 tablet Oral BID Clint BolderHill, Avery S, PA-C   8.6 mg at 08/19/21 [id number]   traMADol (ULTRAM) tablet 50 mg  50 mg Oral Q6H PRN Calvert Cantorizwan, Saima, MD   50 mg at 08/19/21 [id number]     Discharge Medications: Please see discharge summary for a list of discharge medications.  Relevant Imaging Results:  Relevant Lab Results:   Additional Information SSN [id number]  Darleene Cleavernterhaus, Chantal Worthey R, LCSW

## 2021-08-19 NOTE — Plan of Care (Signed)
  Problem: Education: Goal: Knowledge of General Education information will improve Description Including pain rating scale, medication(s)/side effects and non-pharmacologic comfort measures Outcome: Progressing   Problem: Activity: Goal: Risk for activity intolerance will decrease Outcome: Progressing   Problem: Safety: Goal: Ability to remain free from injury will improve Outcome: Progressing   

## 2021-08-19 NOTE — Progress Notes (Signed)
[facility] Progress Note  Patient: Beth Pennington     ZOX:[id number]  DOA: 08/15/2021   PCP: System, Provider Not In       Brief hospital course: This is an 81 year old female with Alzheimer's dementia, diabetes mellitus type 2, hypertension who lives in assisted living/memory care and presented to the hospital after a fall and left hip pain. Found to have a left hip fracture in the ED. Underwent total left hip arthroplasty.  Subjective:  Sitting up in a chair today. Family and RN state she is eating well. RN states that she has not had BM today.   Assessment and Plan: Principal Problem:   Closed left hip fracture, initial encounter (HCC) -Management per orthopedic surgery -Aspirin for DVT prophylaxis along with teds and SCDs - Per PT, she will need SNF  - getting up to the chair with a lot of assistance  Active Problems:  Acute blood loss anemia, normocytic - Hemoglobin has dropped from 12.7>10.7>9.4 - No need to transfuse as of yet    DM (diabetes mellitus), type 2 (HCC) -Continue to hold metformin and treat with NovoLog sliding scale    Dementia with behavioral disturbance (HCC) -Continue Depakote, Haldol, olanzapine, Namenda and Prozac which have been confirmed with the patient's family - she is only oriented to self  Hypertension - Holding losartan as acute blood loss from a fracture can result in AKI - Have ordered labetalol as needed  Hypothyroidism - Continue levothyroxine  Body mass index is 21.29 kg/m.   DVT prophylaxis: Aspirin and teds     Code Status: DNR  Consultants: Orthopedic surgery Level of Care: Level of care: Med-Surg Disposition Plan:  Status is: Inpatient Remains inpatient appropriate because: Awaiting skilled nursing facility Objective:   Vitals:   08/18/21 2024 08/19/21 0108 08/19/21 0617 08/19/21 1446  BP: (!) 141/95 132/69 (!) 157/72 (!) 133/59  Pulse: 94 81 86 91  Resp: 16 20 17 14   Temp: 100.3 F (37.9 C) 99.9 F  (37.7 C) 97.8 F (36.6 C) 99 F (37.2 C)  TempSrc: Oral  Oral Oral  SpO2: 93% 97% 97% 99%  Weight:      Height:       Filed Weights   08/16/21 1733 08/17/21 1246  Weight: 52.8 kg 52.8 kg   Exam: General exam: Appears comfortable  HEENT: PERRLA, oral mucosa moist, no sclera icterus or thrush Respiratory system: Clear to auscultation. Respiratory effort normal. Cardiovascular system: S1 & S2 heard, regular rate and rhythm Gastrointestinal system: Abdomen soft, non-tender, nondistended. Normal bowel sounds   Central nervous system: Alert and oriented only to person. No focal neurological deficits. Extremities: No cyanosis, clubbing or edema Skin: No rashes or ulcers Psychiatry:  Mood & affect appropriate.     Imaging and lab data was personally reviewed    CBC: Recent Labs  Lab 08/15/21 2010 08/16/21 1558 08/17/21 0750 08/18/21 0640 08/19/21 0824  WBC 9.1 7.3 10.1 10.0 7.3  NEUTROABS 7.4 4.4  --   --   --   HGB 12.4 12.2 12.7 10.7* 9.4*  HCT 36.6 36.3 38.4 30.7* 27.6*  MCV 92.0 95.3 95.5 92.7 93.2  PLT 203 193 197 198 236    Basic Metabolic Panel: Recent Labs  Lab 08/15/21 2010 08/16/21 1558  NA 134* 139  K 4.5 4.4  CL 95* 103  CO2 24 28  GLUCOSE 120* 113*  BUN 20 16  CREATININE 0.92 1.00  CALCIUM 9.7 9.2    GFR: Estimated Creatinine Clearance: 35.5  mL/min (by C-G formula based on SCr of 1 mg/dL).  Scheduled Meds:  aspirin  81 mg Oral BID   atorvastatin  40 mg Oral Daily   divalproex  250 mg Oral BID   docusate sodium  100 mg Oral BID   FLUoxetine  5 mg Oral Daily   haloperidol  0.5 mg Oral BID   insulin aspart  0-15 Units Subcutaneous TID WC   levothyroxine  75 mcg Oral Daily   memantine  5 mg Oral Daily   OLANZapine  5 mg Oral QHS   potassium chloride SA  20 mEq Oral Daily   senna  1 tablet Oral BID   Continuous Infusions:  sodium chloride 75 mL/hr at 08/18/21 1820   methocarbamol (ROBAXIN) IV       LOS: 3 days   Author: Calvert Cantor   08/19/2021 4:00 PM

## 2021-08-19 NOTE — TOC Progression Note (Signed)
Transition of Care [facility]) - Progression Note    Patient Details  Name: Beth Pennington MRN: [id number] Date of Birth: 1941/02/02  Transition of Care [facility]) CM/SW Contact  Madisyn Mawhinney, Ervin Knack, [place] Phone Number: 08/19/2021, 4:16 PM  Clinical Narrative:     CSW spoke to patient's son Lorin Picket and daughter in law Marcelino Duster to find out if they have the social security number available so CSW can completed FL2 and [place].  They did not, CSW was able to contact [facility], and they were able to provide SSN for patient.  CSW updated SSN in patient's chart.  Per patient's family the mailing address has also changed, CSW updated mailing address in patient's chart.  CSW explained to patient's daughter what the process is for finding SNF placement.  Per patient's daughter they were asking about CIR, CSW explained the difference between CIR and SNF for rehab.  Patient's family agreed she will not be able to meet criteria for CIR.  CSW explained how insurance pays for stay at SNF, and what to expect.  Patient's family gave CSW permission to begin bed search in [facility].  CSW awaiting for bed offers.  Expected Discharge Plan: Skilled Nursing Facility Barriers to Discharge: Continued Medical Work up, SNF Pending bed offer, Awaiting State Approval Financial controller)  Expected Discharge Plan and Services Expected Discharge Plan: Skilled Nursing Facility In-house Referral: Clinical Social Work   Post Acute Care Choice: Skilled Nursing Facility Living arrangements for the past 2 months: Assisted Living Facility                 DME Arranged: N/A DME Agency: NA                   Social Determinants of Health (SDOH) Interventions    Readmission Risk Interventions     No data to display

## 2021-08-19 NOTE — Plan of Care (Signed)

## 2021-08-19 NOTE — Progress Notes (Signed)
Physical Therapy Treatment Patient Details Name: Beth Pennington MRN: [id number] DOB: 28-Jan-1941 Today's Date: 08/19/2021   History of Present Illness Pt s/p fall with L hip fx and now s/p L THR by anterior direct approach.  Pt with hx of DM and dementia    PT Comments    Pt continues cooperative but requiring repeated multimodal cues and increased time for completion of most tasks.  Pt performed limited therex program with fluctuating level of participation and up to ambulate limited distance in hall before being set up in recliner for lunch.  Recommendations for follow up therapy are one component of a multi-disciplinary discharge planning process, led by the attending physician.  Recommendations may be updated based on patient status, additional functional criteria and insurance authorization.  Follow Up Recommendations  Skilled nursing-short term rehab (<3 hours/day)     Assistance Recommended at Discharge Frequent or constant Supervision/Assistance  Patient can return home with the following A little help with walking and/or transfers;A little help with bathing/dressing/bathroom;Assistance with cooking/housework;Assist for transportation;Help with stairs or ramp for entrance   Equipment Recommendations  None recommended by PT    Recommendations for Other Services       Precautions / Restrictions Precautions Precautions: Fall Restrictions Weight Bearing Restrictions: No LLE Weight Bearing: Weight bearing as tolerated     Mobility  Bed Mobility Overal bed mobility: Needs Assistance Bed Mobility: Supine to Sit     Supine to sit: Mod assist     General bed mobility comments: multi modal cues with assist to manage LEs, control trunk and complete rotation to EOB sitting using pad    Transfers Overall transfer level: Needs assistance Equipment used: Rolling walker (2 wheels) Transfers: Sit to/from Stand Sit to Stand: Min assist, +2 physical assistance, +2  safety/equipment, Mod assist           General transfer comment: cues for use of UEs to self assist; physical assist to bring wt up and fwd and to balance in standing with RW    Ambulation/Gait Ambulation/Gait assistance: Min assist, +2 physical assistance, +2 safety/equipment Gait Distance (Feet): 23 Feet Assistive device: Rolling walker (2 wheels) Gait Pattern/deviations: Step-to pattern, Step-through pattern, Decreased step length - right, Decreased step length - left, Shuffle, Trunk flexed Gait velocity: decr     General Gait Details: cues for posture, sequence, stride length and position from RW with physical assist for balance/support and RW management   Stairs             Wheelchair Mobility    Modified Rankin (Stroke Patients Only)       Balance Overall balance assessment: Needs assistance Sitting-balance support: No upper extremity supported, Feet supported Sitting balance-[name] Scale: Fair     Standing balance support: Bilateral upper extremity supported Standing balance-[name] Scale: Poor                              Cognition Arousal/Alertness: Awake/alert (Initially lethargic bout fully awake with move to EOB) Behavior During Therapy: Flat affect, Impulsive Overall Cognitive Status: History of cognitive impairments - at baseline                                          Exercises Total Joint Exercises Ankle Circles/Pumps: AAROM, Both, 15 reps, Supine Heel Slides: AAROM, Left, Supine, 20 reps Hip ABduction/ADduction: AAROM, Left,  15 reps, Supine    General Comments        Pertinent Vitals/Pain Pain Assessment Pain Assessment: Faces Faces Pain Scale: Hurts little more Pain Location: L hip with activity Pain Descriptors / Indicators: Grimacing Pain Intervention(s): Limited activity within patient's tolerance, Monitored during session    Home Living                          Prior Function             PT Goals (current goals can now be found in the care plan section) Acute Rehab PT Goals Patient Stated Goal: NO goals stated PT Goal Formulation: With patient Time For Goal Achievement: 09/01/21 Potential to Achieve Goals: Good Progress towards PT goals: Progressing toward goals    Frequency    Min 3X/week      PT Plan Current plan remains appropriate    Co-evaluation              AM-PAC PT "6 Clicks" Mobility   Outcome Measure  Help needed turning from your back to your side while in a flat bed without using bedrails?: A Lot Help needed moving from lying on your back to sitting on the side of a flat bed without using bedrails?: A Lot Help needed moving to and from a bed to a chair (including a wheelchair)?: A Lot Help needed standing up from a chair using your arms (e.g., wheelchair or bedside chair)?: A Lot Help needed to walk in hospital room?: A Lot Help needed climbing 3-5 steps with a railing? : Total 6 Click Score: 11    End of Session Equipment Utilized During Treatment: Gait belt Activity Tolerance: Patient tolerated treatment well;Patient limited by fatigue Patient left: in chair;with call bell/phone within reach;with chair alarm set;with family/visitor present Nurse Communication: Mobility status PT Visit Diagnosis: Unsteadiness on feet (R26.81)     Time: 0086-7619 PT Time Calculation (min) (ACUTE ONLY): 27 min  Charges:  $Gait Training: 8-22 mins $Therapeutic Exercise: 8-22 mins                     Mauro Kaufmann PT Acute Rehabilitation Services Pager [phone] Office [phone]    [facility] 08/19/2021, 12:37 PM

## 2021-08-20 DIAGNOSIS — F03918 Unspecified dementia, unspecified severity, with other behavioral disturbance: Secondary | ICD-10-CM | POA: Diagnosis not present

## 2021-08-20 DIAGNOSIS — S72002A Fracture of unspecified part of neck of left femur, initial encounter for closed fracture: Secondary | ICD-10-CM | POA: Diagnosis not present

## 2021-08-20 DIAGNOSIS — Z96642 Presence of left artificial hip joint: Secondary | ICD-10-CM | POA: Diagnosis not present

## 2021-08-20 DIAGNOSIS — E1169 Type 2 diabetes mellitus with other specified complication: Secondary | ICD-10-CM | POA: Diagnosis not present

## 2021-08-20 LAB — GLUCOSE, CAPILLARY
Glucose-Capillary: 113 mg/dL — ABNORMAL HIGH (ref 70–99)
Glucose-Capillary: 145 mg/dL — ABNORMAL HIGH (ref 70–99)
Glucose-Capillary: 180 mg/dL — ABNORMAL HIGH (ref 70–99)

## 2021-08-20 NOTE — Progress Notes (Signed)
Pt up to chair with two person assist.

## 2021-08-20 NOTE — Progress Notes (Signed)
[facility] Progress Note  Patient: Beth Pennington     FEO:[id number]  DOA: 08/15/2021   PCP: System, Provider Not In       Brief hospital course: This is an 81 year old female with severe Alzheimer's dementia w/o behavioral issues, diabetes mellitus type 2, hypertension who lives in assisted living/memory care and presented to the hospital after a fall and left hip pain. Found to have a left hip fracture in the ED. Underwent total left hip arthroplasty on 6/8 and is waiting on a SNF bed.  Subjective:  No complaints. I've asked the family and the RN. There has not been any BM.  She has been eating very well.  Assessment and Plan: Principal Problem:   Closed left hip fracture, initial encounter (HCC) -Management per orthopedic surgery -Aspirin for DVT prophylaxis along with teds and SCDs - Per PT, she will need SNF  - getting up to the chair with a lot of assistance  Active Problems:  Constipation - related to poor mobility  - Miralax and Senna being given - will give a Dulcolax suppository today  Acute blood loss anemia, normocytic - Hemoglobin has dropped from 12.7>10.7>9.4 - No need to transfuse as of yet    DM (diabetes mellitus), type 2 (HCC) -Continue to hold metformin and treat with NovoLog sliding scale    Dementia with behavioral disturbance (HCC) -Continue Depakote, Haldol, olanzapine, Namenda and Prozac which have been confirmed with the patient's family - she is only oriented to self  Hypertension - Holding losartan as acute blood loss from a fracture can result in AKI - Have ordered labetalol as needed  Hypothyroidism - Continue levothyroxine  Body mass index is 21.29 kg/m.   DVT prophylaxis: Aspirin and teds     Code Status: DNR  Consultants: Orthopedic surgery Level of Care: Level of care: Med-Surg Disposition Plan:  Status is: Inpatient Remains inpatient appropriate because: Awaiting skilled nursing facility Objective:   Vitals:    08/19/21 1446 08/19/21 2131 08/20/21 0012 08/20/21 0449  BP: (!) 133/59 (!) 185/80 (!) 164/84 139/74  Pulse: 91 86 87 85  Resp: 14 16  14   Temp: 99 F (37.2 C) 98.2 F (36.8 C)  97.8 F (36.6 C)  TempSrc: Oral Oral  Oral  SpO2: 99% 100%  94%  Weight:      Height:       Filed Weights   08/16/21 1733 08/17/21 1246  Weight: 52.8 kg 52.8 kg   Exam: General exam: Appears comfortable  HEENT: PERRLA, oral mucosa moist, no sclera icterus or thrush Respiratory system: Clear to auscultation. Respiratory effort normal. Cardiovascular system: S1 & S2 heard, regular rate and rhythm Gastrointestinal system: Abdomen soft, non-tender, nondistended. Normal bowel sounds   Central nervous system: Alert and oriented only to person. No focal neurological deficits. Extremities: No cyanosis, clubbing or edema Skin: No rashes or ulcers Psychiatry:  Mood & affect appropriate.     Imaging and lab data was personally reviewed    CBC: Recent Labs  Lab 08/15/21 2010 08/16/21 1558 08/17/21 0750 08/18/21 0640 08/19/21 0824  WBC 9.1 7.3 10.1 10.0 7.3  NEUTROABS 7.4 4.4  --   --   --   HGB 12.4 12.2 12.7 10.7* 9.4*  HCT 36.6 36.3 38.4 30.7* 27.6*  MCV 92.0 95.3 95.5 92.7 93.2  PLT 203 193 197 198 236    Basic Metabolic Panel: Recent Labs  Lab 08/15/21 2010 08/16/21 1558  NA 134* 139  K 4.5 4.4  CL 95*  103  CO2 24 28  GLUCOSE 120* 113*  BUN 20 16  CREATININE 0.92 1.00  CALCIUM 9.7 9.2    GFR: Estimated Creatinine Clearance: 35.5 mL/min (by C-G formula based on SCr of 1 mg/dL).  Scheduled Meds:  aspirin  81 mg Oral BID   atorvastatin  40 mg Oral Daily   divalproex  250 mg Oral BID   docusate sodium  100 mg Oral BID   FLUoxetine  5 mg Oral Daily   haloperidol  0.5 mg Oral BID   insulin aspart  0-15 Units Subcutaneous TID WC   levothyroxine  75 mcg Oral Daily   memantine  5 mg Oral Daily   OLANZapine  5 mg Oral QHS   polyethylene glycol  17 g Oral Daily   potassium chloride  SA  20 mEq Oral Daily   senna  1 tablet Oral BID   Continuous Infusions:  sodium chloride 75 mL/hr at 08/18/21 1820   methocarbamol (ROBAXIN) IV       LOS: 4 days   Author: Debbe Odea  08/20/2021 12:17 PM

## 2021-08-20 NOTE — Plan of Care (Signed)
  Problem: Pain Managment: Goal: General experience of comfort will improve Outcome: Progressing   Problem: Safety: Goal: Ability to remain free from injury will improve Outcome: Progressing   Problem: Skin Integrity: Goal: Risk for impaired skin integrity will decrease Outcome: Progressing   

## 2021-08-21 DIAGNOSIS — D62 Acute posthemorrhagic anemia: Secondary | ICD-10-CM

## 2021-08-21 LAB — GLUCOSE, CAPILLARY
Glucose-Capillary: 138 mg/dL — ABNORMAL HIGH (ref 70–99)
Glucose-Capillary: 137 mg/dL — ABNORMAL HIGH (ref 70–99)
Glucose-Capillary: 90 mg/dL (ref 70–99)
Glucose-Capillary: 246 mg/dL — ABNORMAL HIGH (ref 70–99)

## 2021-08-21 LAB — BASIC METABOLIC PANEL
Anion gap: 7 (ref 5–15)
BUN: 18 mg/dL (ref 8–23)
CO2: 28 mmol/L (ref 22–32)
Calcium: 8.6 mg/dL — ABNORMAL LOW (ref 8.9–10.3)
Chloride: 99 mmol/L (ref 98–111)
Creatinine, Ser: 0.89 mg/dL (ref 0.44–1.00)
GFR, Estimated: >60 mL/min (ref >60)
Glucose, Bld: 150 mg/dL — ABNORMAL HIGH (ref 70–99)
Potassium: 4.1 mmol/L (ref 3.5–5.1)
Sodium: 134 mmol/L — ABNORMAL LOW (ref 135–145)

## 2021-08-21 MED ORDER — DOCUSATE SODIUM 100 MG PO CAPS: 100.0000 mg | ORAL_CAPSULE | Freq: Two times a day (BID) | ORAL | 0 refills | Status: DC

## 2021-08-21 MED ORDER — POLYETHYLENE GLYCOL 3350 17 G PO PACK: 17.0000 g | PACK | Freq: Every day | ORAL | 0 refills | Status: AC

## 2021-08-21 MED ORDER — BISACODYL 10 MG RE SUPP: 10.0000 mg | Freq: Every day | RECTAL | 0 refills | Status: DC | PRN

## 2021-08-21 MED ORDER — SENNA 8.6 MG PO TABS: 1.0000 | ORAL_TABLET | Freq: Two times a day (BID) | ORAL | 0 refills | Status: DC

## 2021-08-21 NOTE — Discharge Summary (Addendum)
Physician Discharge Summary  Beth Pennington OBS:[id number] DOB: May 04, 1940 DOA: 08/15/2021  PCP: System, Provider Not In  Admit date: 08/15/2021 Discharge date: 08/22/2021 Discharging to: Skilled nursing facility Recommendations for Outpatient Follow-up:  Follow-up oral intake  Consults:  Orthopedic surgery Procedures:  Left total hip arthroplasty   Discharge Diagnoses:   Principal Problem:   Closed left hip fracture, initial encounter (HCC) Active Problems:   S/P total left hip arthroplasty   DM (diabetes mellitus), type 2 (HCC)   Dementia without behavioral disturbance (HCC)   Acute postoperative anemia due to expected blood loss     Hospital Course: This is an 81 year old female with severe Alzheimer's dementia w/o behavioral issues, diabetes mellitus type 2, hypertension who lives in assisted living/memory care and presented to the hospital after a fall and left hip pain. Found to have a left hip fracture in the ED. Underwent total left hip arthroplasty on 6/8.  Received PT eval on 6/9 recommending SNF and was waiting on a SNF bed over the weekend.  Principal Problem:   Closed left hip fracture, initial encounter (HCC) -Management per orthopedic surgery -Aspirin for DVT prophylaxis along with teds and SCDs - Per PT, she will need SNF  - getting up to the chair with a lot of assistance   Active Problems:  Constipation - related to poor mobility  - Miralax and Senna being given -She was able to have a bowel movement yesterday morning   Acute blood loss anemia, normocytic - Hemoglobin has dropped from 12.7>10.7>9.4 - No need to transfuse       DM (diabetes mellitus), type 2 (HCC) - Can resume home metformin on discharge    Dementia  -Continue Depakote, Haldol, olanzapine, Namenda and Prozac which have been confirmed with the patient's family - she is only oriented to self-has not had any behavioral disturbances in the hospital  Hypertension - Resume  losartan  Hypothyroidism - Continue levothyroxine   Body mass index is 21.29 kg/m.         Discharge Instructions  Discharge Instructions     Diet - low sodium heart healthy   Complete by: As directed    Diet Carb Modified   Complete by: As directed    Increase activity slowly   Complete by: As directed    No wound care   Complete by: As directed       Allergies as of 08/22/2021   No Known Allergies      Medication List     TAKE these medications    aspirin 81 MG chewable tablet Commonly known as: Aspirin Childrens Chew 1 tablet (81 mg total) by mouth 2 (two) times daily with a meal.   atorvastatin 40 MG tablet Commonly known as: LIPITOR Take 40 mg by mouth daily.   BAZA PROTECT EX See admin instructions. Apply topically to sacral area twice daily for protection   bisacodyl 10 MG suppository Commonly known as: DULCOLAX Place 1 suppository (10 mg total) rectally daily as needed for moderate constipation.   divalproex 250 MG DR tablet Commonly known as: DEPAKOTE Take 250 mg by mouth 2 (two) times daily.   docusate sodium 100 MG capsule Commonly known as: COLACE Take 1 capsule (100 mg total) by mouth 2 (two) times daily.   FLUoxetine 10 MG tablet Commonly known as: PROZAC Take 5 mg by mouth at bedtime.   FLUoxetine 10 MG capsule Commonly known as: PROZAC Take 10 mg by mouth daily.   haloperidol 0.5 MG tablet Commonly  known as: HALDOL Take 0.5 mg by mouth 2 (two) times daily.   HYDROcodone-acetaminophen 10-325 MG tablet Commonly known as: Norco Take 0.5 tablets by mouth every 4 (four) hours as needed for up to 7 days for moderate pain or severe pain.   levothyroxine 75 MCG tablet Commonly known as: SYNTHROID Take 75 mcg by mouth daily.   losartan 50 MG tablet Commonly known as: COZAAR Take 50 mg by mouth daily.   melatonin 3 MG Tabs tablet Take 3 mg by mouth at bedtime.   memantine 5 MG tablet Commonly known as: NAMENDA Take 5 mg by  mouth daily.   metFORMIN 1000 MG tablet Commonly known as: GLUCOPHAGE Take 1,000 mg by mouth 2 (two) times daily.   OcuSoft Lid Scrub Original Pads Apply topically See admin instructions. Apply to both eyes at bedtime   OLANZapine 5 MG tablet Commonly known as: ZYPREXA Take 5 mg by mouth at bedtime.   polyethylene glycol 17 g packet Commonly known as: MIRALAX / GLYCOLAX Take 17 g by mouth daily.   potassium chloride SA 20 MEQ tablet Commonly known as: KLOR-CON M Take 20 mEq by mouth daily.   PSYLLIUM PO Take by mouth See admin instructions. 1 capsule qd, changed order 08-11-21 from 0.52gm   senna 8.6 MG Tabs tablet Commonly known as: SENOKOT Take 1 tablet (8.6 mg total) by mouth 2 (two) times daily.   vitamin B-12 1000 MCG tablet Commonly known as: CYANOCOBALAMIN Take 1,000 mcg by mouth daily.   Vitamin D (Ergocalciferol) 1.25 MG (50000 UNIT) Caps capsule Commonly known as: DRISDOL Take 50,000 Units by mouth once a week.        Follow-up Information     Swinteck, Arlys JohnBrian, MD Follow up in 2 week(s).   Specialty: Orthopedic Surgery Why: For suture removal, For wound re-check Contact information: [address] [place] [address] [phone]                    The results of significant diagnostics from this hospitalization (including imaging, microbiology, ancillary and laboratory) are listed below for reference.    DG Pelvis Portable  Result Date: 08/17/2021 CLINICAL DATA:  Closed displaced fracture of left femoral neck, postop hip replacement. EXAM: PORTABLE PELVIS 1-2 VIEWS COMPARISON:  08/17/2021. FINDINGS: Total hip arthroplasty changes are noted on the left. Alignment is within normal limits. There is bony deformity of the residual femoral neck which may be due to recent fracture or postsurgical changes. The remaining bony structures are intact. Degenerative changes are noted in the lower lumbar spine. Surgical staples in air are noted  in the soft tissues of the left hip. IMPRESSION: Status post total hip arthroplasty on the left. There is bony deformity of the residual femoral neck which may be related to recent fracture or postoperative changes. Electronically Signed   By: Thornell SartoriusLaura  Taylor M.D.   On: 08/17/2021 23:27   DG HIP UNILAT WITH PELVIS 1V LEFT  Result Date: 08/17/2021 CLINICAL DATA:  Left total hip arthroplasty EXAM: DG HIP (WITH OR WITHOUT PELVIS) 1V*L* COMPARISON:  08/15/2021 FINDINGS: Two images show total hip arthroplasty on the left. Components appear well positioned. No radiographically detectable complication. IMPRESSION: Good appearance following total hip arthroplasty on the left. Electronically Signed   By: Paulina FusiMark  Shogry M.D.   On: 08/17/2021 14:59   DG C-Arm 1-60 Min-No Report  Result Date: 08/17/2021 Fluoroscopy was utilized by the requesting physician.  No radiographic interpretation.   DG C-Arm 1-60 Min-No  Report  Result Date: 08/17/2021 Fluoroscopy was utilized by the requesting physician.  No radiographic interpretation.   DG Knee Left Port  Result Date: 08/16/2021 CLINICAL DATA:  LEFT hip fracture EXAM: PORTABLE LEFT KNEE - 1-2 VIEW COMPARISON:  Pelvis 08/15/2021 FINDINGS: No fracture of the proximal tibia or distal femur. Patella is normal. No joint effusion. IMPRESSION: No fracture or dislocation. Electronically Signed   By: Genevive Bi M.D.   On: 08/16/2021 11:19   DG Chest Portable 1 View  Result Date: 08/15/2021 CLINICAL DATA:  fall EXAM: PORTABLE CHEST 1 VIEW.  Patient is rotated. COMPARISON:  None Available. FINDINGS: The heart and mediastinal contours are within normal limits. No focal consolidation. No pulmonary edema. No pleural effusion. No pneumothorax. No acute osseous abnormality. IMPRESSION: No active disease. Electronically Signed   By: Tish Frederickson M.D.   On: 08/15/2021 20:39   DG Hip Unilat W or Wo Pelvis 2-3 Views Left  Result Date: 08/15/2021 CLINICAL DATA:  Trauma, fall EXAM:  DG HIP (WITH OR WITHOUT PELVIS) 2-3V LEFT COMPARISON:  None Available. FINDINGS: There is comminuted subcapital fracture of neck of left femur. There is no dislocation. Osteopenia is seen in bony structures. IMPRESSION: There is comminuted, impacted fracture in the subcapital portion of neck of left femur. Electronically Signed   By: Ernie Avena M.D.   On: 08/15/2021 17:47   Labs:   Basic Metabolic Panel: Recent Labs  Lab 08/15/21 2010 08/16/21 1558 08/21/21 0830  NA 134* 139 134*  K 4.5 4.4 4.1  CL 95* 103 99  CO2 24 28 28   GLUCOSE 120* 113* 150*  BUN 20 16 18   CREATININE 0.92 1.00 0.89  CALCIUM 9.7 9.2 8.6*     CBC: Recent Labs  Lab 08/15/21 2010 08/16/21 1558 08/17/21 0750 08/18/21 0640 08/19/21 0824  WBC 9.1 7.3 10.1 10.0 7.3  NEUTROABS 7.4 4.4  --   --   --   HGB 12.4 12.2 12.7 10.7* 9.4*  HCT 36.6 36.3 38.4 30.7* 27.6*  MCV 92.0 95.3 95.5 92.7 93.2  PLT 203 193 197 198 236         SIGNED:   10/18/21, MD  [facility] 08/22/2021, 9:05 AM

## 2021-08-21 NOTE — TOC Progression Note (Signed)
Transition of Care [facility]) - Progression Note   Patient Details  Name: Beth Pennington MRN: [id number] Date of Birth: October 02, 1940  [facility]) CM/SW Contact  Ewing Schlein, LCSW Phone Number: 08/21/2021, 3:47 PM  Clinical Narrative: Patient is medically stable to discharge to SNF pending bed choice and insurance authorization. CSW provided patient's son, Kimoni Pickerill, with bed choices. Son selected [facility] and CSW confirmed bed with Velna Hatchet at [facility]. CSW started insurance authorization on [employer] portal. Plan auth ID # is: [id number]. Reference ID # is: [id number]. Authorization is still pending, so CSW followed up with [employer] and was informed it is still under review. CSW updated son, hospitalist, and facility regarding the delay in approval.    Expected Discharge Plan: Skilled Nursing Facility Barriers to Discharge: Insurance Authorization  Expected Discharge Plan and Services Expected Discharge Plan: Skilled Nursing Facility In-house Referral: Clinical Social Work Post Acute Care Choice: Skilled Nursing Facility Living arrangements for the past 2 months: Assisted Living Facility Expected Discharge Date: 08/21/21               DME Arranged: N/A DME Agency: NA  Readmission Risk Interventions     No data to display

## 2021-08-21 NOTE — Progress Notes (Signed)
Physical Therapy Treatment Patient Details Name: Beth Pennington MRN: [id number] DOB: 08-Jan-1941 Today's Date: 08/21/2021   History of Present Illness Pt s/p fall with L hip fx and now s/p L THR by anterior direct approach.  Pt with hx of DM and dementia    PT Comments    Pt very lethargic, did not arouse to verbal nor tactile stimulation, did not open eyes at all during PT session. Performed supine to sit and sit to stand with +2 total assist, in hopes that movement would help wake her up. She required mod assist for static sitting balance. Pt not able to participate in PT session 2* lethargy. Daughter in law stated pt had haldol last night and is wondering if that can be DCed (RN aware). Performed PROM to L hip, no signs of pain with movement.     Recommendations for follow up therapy are one component of a multi-disciplinary discharge planning process, led by the attending physician.  Recommendations may be updated based on patient status, additional functional criteria and insurance authorization.  Follow Up Recommendations  Skilled nursing-short term rehab (<3 hours/day)     Assistance Recommended at Discharge    Patient can return home with the following A little help with walking and/or transfers;A little help with bathing/dressing/bathroom;Assistance with cooking/housework;Assist for transportation;Help with stairs or ramp for entrance   Equipment Recommendations  None recommended by PT    Recommendations for Other Services       Precautions / Restrictions Precautions Precautions: Fall Restrictions Weight Bearing Restrictions: No LLE Weight Bearing: Weight bearing as tolerated     Mobility  Bed Mobility Overal bed mobility: Needs Assistance Bed Mobility: Supine to Sit     Supine to sit: +2 for physical assistance, Total assist     General bed mobility comments: pt did not open her eyes during PT session, no response to verbal /tactile stimulation, per daughter in  law pt    Transfers Overall transfer level: Needs assistance Equipment used: Rolling walker (2 wheels) Transfers: Sit to/from Stand Sit to Stand: Total assist, +2 physical assistance           General transfer comment: +2 assist to stand, pt did not open eyes, did mumble a word occaissionally but pt not alert, not able to participate in PT    Ambulation/Gait               General Gait Details: deferred 2* pt lethargy   Stairs             Wheelchair Mobility    Modified Rankin (Stroke Patients Only)       Balance Overall balance assessment: Needs assistance Sitting-balance support: No upper extremity supported, Feet supported Sitting balance-[name] Scale: Poor Sitting balance - Comments: assist to maintain static sitting Postural control: Posterior lean Standing balance support: Bilateral upper extremity supported Standing balance-[name] Scale: Zero                              Cognition Arousal/Alertness: Lethargic, Suspect due to medications Behavior During Therapy: Flat affect Overall Cognitive Status: Impaired/Different from baseline                                 General Comments: daughter in law reports pt is more lethargic in the hospital, unable to arouse pt this session, daugther in law reports pt was given haldol last  night        Exercises Total Joint Exercises Heel Slides: Left, Supine, PROM, 10 reps Hip ABduction/ADduction: Left, Supine, PROM, 10 reps    General Comments        Pertinent Vitals/Pain Pain Assessment Faces Pain Scale: No hurt Breathing: normal Negative Vocalization: none Facial Expression: smiling or inexpressive Body Language: relaxed Consolability: no need to console PAINAD Score: 0 Pain Location: no grimacing with ROM to L hip Pain Intervention(s): Limited activity within patient's tolerance, Monitored during session, Premedicated before session    Home Living                           Prior Function            PT Goals (current goals can now be found in the care plan section) Acute Rehab PT Goals Patient Stated Goal: NO goals stated PT Goal Formulation: With patient Time For Goal Achievement: 09/01/21 Potential to Achieve Goals: Good Progress towards PT goals: Not progressing toward goals - comment (lethargic)    Frequency    Min 3X/week      PT Plan Current plan remains appropriate    Co-evaluation              AM-PAC PT "6 Clicks" Mobility   Outcome Measure  Help needed turning from your back to your side while in a flat bed without using bedrails?: Total Help needed moving from lying on your back to sitting on the side of a flat bed without using bedrails?: Total Help needed moving to and from a bed to a chair (including a wheelchair)?: Total Help needed standing up from a chair using your arms (e.g., wheelchair or bedside chair)?: Total Help needed to walk in hospital room?: Total Help needed climbing 3-5 steps with a railing? : Total 6 Click Score: 6    End of Session Equipment Utilized During Treatment: Gait belt Activity Tolerance: Patient tolerated treatment well;Patient limited by fatigue Patient left: in chair;with call bell/phone within reach;with chair alarm set;with family/visitor present Nurse Communication: Mobility status PT Visit Diagnosis: Unsteadiness on feet (R26.81)     Time: 0300-9233 PT Time Calculation (min) (ACUTE ONLY): 13 min  Charges:  $Therapeutic Activity: 8-22 mins                     Ralene Bathe Kistler PT 08/21/2021  Acute Rehabilitation Services Pager [phone] Office [phone]

## 2021-08-21 NOTE — Plan of Care (Signed)
?  Problem: Clinical Measurements: ?Goal: Will remain free from infection ?Outcome: Progressing ?  ?

## 2021-08-21 NOTE — TOC Transition Note (Deleted)
Transition of Care [facility]) - CM/SW Discharge Note  Patient Details  Name: LACREASHA HINDS MRN: [id number] Date of Birth: January 27, 1941  [facility] of [facility]) CM/SW Contact:  Ewing Schlein, LCSW Phone Number: 08/21/2021, 3:35 PM  Clinical Narrative: Patient is medically stable to discharge to SNF pending bed choice and insurance authorization. CSW provided patient's son, Arlen Legendre, with bed choices. Son selected [facility] and CSW confirmed bed with Velna Hatchet at [facility]. CSW started insurance authorization on [employer] portal. Plan auth ID # is: [id number]. Reference ID # is: [id number]. Authorization is still pending, so CSW followed up with [employer] and was informed it is still under review. CSW updated son, hospitalist, and facility regarding the delay in approval.  Barriers to Discharge: Insurance Authorization  Patient Goals and CMS Choice Patient states their goals for this hospitalization and ongoing recovery are:: Go to short-term rehab before returning home CMS Medicare.gov Compare Post Acute Care list provided to:: Patient Represenative (must comment) Choice offered to / list presented to : Adult Children  Discharge Plan and Services In-house Referral: Clinical Social Work Post Acute Care Choice: Skilled Nursing Facility          DME Arranged: N/A DME Agency: NA  Readmission Risk Interventions     No data to display

## 2021-08-22 DIAGNOSIS — R2689 Other abnormalities of gait and mobility: Secondary | ICD-10-CM | POA: Diagnosis not present

## 2021-08-22 DIAGNOSIS — R5381 Other malaise: Secondary | ICD-10-CM | POA: Diagnosis not present

## 2021-08-22 DIAGNOSIS — M6281 Muscle weakness (generalized): Secondary | ICD-10-CM | POA: Diagnosis not present

## 2021-08-22 DIAGNOSIS — Z9181 History of falling: Secondary | ICD-10-CM | POA: Diagnosis not present

## 2021-08-22 DIAGNOSIS — S72002D Fracture of unspecified part of neck of left femur, subsequent encounter for closed fracture with routine healing: Secondary | ICD-10-CM | POA: Diagnosis not present

## 2021-08-22 DIAGNOSIS — M2550 Pain in unspecified joint: Secondary | ICD-10-CM | POA: Diagnosis not present

## 2021-08-22 DIAGNOSIS — I10 Essential (primary) hypertension: Secondary | ICD-10-CM | POA: Diagnosis not present

## 2021-08-22 DIAGNOSIS — W19XXXA Unspecified fall, initial encounter: Secondary | ICD-10-CM | POA: Diagnosis not present

## 2021-08-22 DIAGNOSIS — G301 Alzheimer's disease with late onset: Secondary | ICD-10-CM | POA: Diagnosis not present

## 2021-08-22 DIAGNOSIS — S72032D Displaced midcervical fracture of left femur, subsequent encounter for closed fracture with routine healing: Secondary | ICD-10-CM | POA: Diagnosis not present

## 2021-08-22 DIAGNOSIS — E119 Type 2 diabetes mellitus without complications: Secondary | ICD-10-CM | POA: Diagnosis not present

## 2021-08-22 DIAGNOSIS — E039 Hypothyroidism, unspecified: Secondary | ICD-10-CM | POA: Diagnosis not present

## 2021-08-22 DIAGNOSIS — Z7401 Bed confinement status: Secondary | ICD-10-CM | POA: Diagnosis not present

## 2021-08-22 DIAGNOSIS — R2681 Unsteadiness on feet: Secondary | ICD-10-CM | POA: Diagnosis not present

## 2021-08-22 DIAGNOSIS — I69828 Other speech and language deficits following other cerebrovascular disease: Secondary | ICD-10-CM | POA: Diagnosis not present

## 2021-08-22 LAB — GLUCOSE, CAPILLARY
Glucose-Capillary: 139 mg/dL — ABNORMAL HIGH (ref 70–99)
Glucose-Capillary: 171 mg/dL — ABNORMAL HIGH (ref 70–99)

## 2021-08-22 NOTE — Care Management Important Message (Signed)
Important Message  Patient Details IM Letter placed in Patients room. Name: Beth Pennington MRN: [id number] Date of Birth: [birth year]   Medicare Important Message Given:  Yes     Caren Macadam 08/22/2021, 11:19 AM

## 2021-08-22 NOTE — Progress Notes (Signed)
Nurse attempted x 2 to call [name] at the number left by Child psychotherapist.  No answer each time and no way to leave message.  PTAR arrived to unit and pt is now on her way to [facility].  Nurse left message on [name] phone that pt had left [facility].

## 2021-08-22 NOTE — Progress Notes (Signed)
24 hour chart audit completed 

## 2021-08-22 NOTE — Progress Notes (Signed)
TRH Progress note  Patient stable to dc to SNF today. Please see DC summary from yesterday which I have updated today.   Calvert Cantor, MD

## 2021-08-22 NOTE — TOC Transition Note (Signed)
Transition of Care [facility]) - CM/SW Discharge Note  Patient Details  Name: Beth Pennington MRN: [id number] Date of Birth: 10/17/1940  Transition of Care [facility]) CM/SW Contact:  Beth Schlein, LCSW Phone Number: 08/22/2021, 10:05 AM  Clinical Narrative: Patient has received insurance authorization and is approved for 6/12-6/14. CSW updated Beth Pennington in admissions at [facility]. Discharge summary, discharge orders, and SNF transfer report faxed to facility in hub. The number for report is [phone]. CSW updated daughter-in-law in patient's room and left VM for son regarding discharge.  Medical necessity form done; PTAR scheduled. Discharge packet completed. RN updated. TOC signing off.  Final next level of care: Skilled Nursing Facility Barriers to Discharge: Barriers Resolved  Patient Goals and CMS Choice Patient states their goals for this hospitalization and ongoing recovery are:: Go to short-term rehab before returning home CMS Medicare.gov Compare Post Acute Care list provided to:: Patient Represenative (must comment) Choice offered to / list presented to : Adult Children  Discharge Placement PASRR number recieved: 08/19/21      Patient chooses bed at: [facility] Patient to be transferred to facility by: PTAR Name of family member notified: Beth Pennington and Beth Pennington (son & daughter-in-law) Patient and family notified of of transfer: 08/22/21  Discharge Plan and Services In-house Referral: Clinical Social Work Post Acute Care Choice: Skilled Nursing Facility          DME Arranged: N/A DME Agency: NA  Readmission Risk Interventions     No data to display

## 2021-08-23 DIAGNOSIS — M6281 Muscle weakness (generalized): Secondary | ICD-10-CM | POA: Diagnosis not present

## 2021-08-23 DIAGNOSIS — I10 Essential (primary) hypertension: Secondary | ICD-10-CM | POA: Diagnosis not present

## 2021-08-23 DIAGNOSIS — S72002D Fracture of unspecified part of neck of left femur, subsequent encounter for closed fracture with routine healing: Secondary | ICD-10-CM | POA: Diagnosis not present

## 2021-08-30 ENCOUNTER — Other Ambulatory Visit: Payer: Self-pay | Admitting: *Deleted

## 2021-08-30 NOTE — Patient Outreach (Incomplete)

## 2021-09-01 DIAGNOSIS — W19XXXA Unspecified fall, initial encounter: Secondary | ICD-10-CM | POA: Diagnosis not present

## 2021-09-01 DIAGNOSIS — M6281 Muscle weakness (generalized): Secondary | ICD-10-CM | POA: Diagnosis not present

## 2021-09-07 DIAGNOSIS — Z9181 History of falling: Secondary | ICD-10-CM | POA: Diagnosis not present

## 2021-09-07 DIAGNOSIS — G301 Alzheimer's disease with late onset: Secondary | ICD-10-CM | POA: Diagnosis not present

## 2021-09-07 DIAGNOSIS — E039 Hypothyroidism, unspecified: Secondary | ICD-10-CM | POA: Diagnosis not present

## 2021-09-07 DIAGNOSIS — E119 Type 2 diabetes mellitus without complications: Secondary | ICD-10-CM | POA: Diagnosis not present

## 2021-09-07 DIAGNOSIS — M6281 Muscle weakness (generalized): Secondary | ICD-10-CM | POA: Diagnosis not present

## 2021-09-07 DIAGNOSIS — S72002D Fracture of unspecified part of neck of left femur, subsequent encounter for closed fracture with routine healing: Secondary | ICD-10-CM | POA: Diagnosis not present

## 2021-09-08 DIAGNOSIS — S72032D Displaced midcervical fracture of left femur, subsequent encounter for closed fracture with routine healing: Secondary | ICD-10-CM | POA: Diagnosis not present

## 2021-09-11 DIAGNOSIS — G301 Alzheimer's disease with late onset: Secondary | ICD-10-CM | POA: Diagnosis not present

## 2021-09-11 DIAGNOSIS — E039 Hypothyroidism, unspecified: Secondary | ICD-10-CM | POA: Diagnosis not present

## 2021-09-11 DIAGNOSIS — E785 Hyperlipidemia, unspecified: Secondary | ICD-10-CM | POA: Diagnosis not present

## 2021-09-11 DIAGNOSIS — I10 Essential (primary) hypertension: Secondary | ICD-10-CM | POA: Diagnosis not present

## 2021-09-11 DIAGNOSIS — S72002D Fracture of unspecified part of neck of left femur, subsequent encounter for closed fracture with routine healing: Secondary | ICD-10-CM | POA: Diagnosis not present

## 2021-09-11 DIAGNOSIS — E119 Type 2 diabetes mellitus without complications: Secondary | ICD-10-CM | POA: Diagnosis not present

## 2021-09-11 DIAGNOSIS — M6281 Muscle weakness (generalized): Secondary | ICD-10-CM | POA: Diagnosis not present

## 2021-09-11 DIAGNOSIS — Z9181 History of falling: Secondary | ICD-10-CM | POA: Diagnosis not present

## 2021-09-12 DIAGNOSIS — E038 Other specified hypothyroidism: Secondary | ICD-10-CM | POA: Diagnosis not present

## 2021-09-12 DIAGNOSIS — R634 Abnormal weight loss: Secondary | ICD-10-CM | POA: Diagnosis not present

## 2021-09-12 DIAGNOSIS — E782 Mixed hyperlipidemia: Secondary | ICD-10-CM | POA: Diagnosis not present

## 2021-09-12 DIAGNOSIS — R2689 Other abnormalities of gait and mobility: Secondary | ICD-10-CM | POA: Diagnosis not present

## 2021-09-12 DIAGNOSIS — E119 Type 2 diabetes mellitus without complications: Secondary | ICD-10-CM | POA: Diagnosis not present

## 2021-09-13 DIAGNOSIS — Z7984 Long term (current) use of oral hypoglycemic drugs: Secondary | ICD-10-CM | POA: Diagnosis not present

## 2021-09-13 DIAGNOSIS — Z96642 Presence of left artificial hip joint: Secondary | ICD-10-CM | POA: Diagnosis not present

## 2021-09-13 DIAGNOSIS — I10 Essential (primary) hypertension: Secondary | ICD-10-CM | POA: Diagnosis not present

## 2021-09-13 DIAGNOSIS — D649 Anemia, unspecified: Secondary | ICD-10-CM | POA: Diagnosis not present

## 2021-09-13 DIAGNOSIS — Z9181 History of falling: Secondary | ICD-10-CM | POA: Diagnosis not present

## 2021-09-13 DIAGNOSIS — S72002D Fracture of unspecified part of neck of left femur, subsequent encounter for closed fracture with routine healing: Secondary | ICD-10-CM | POA: Diagnosis not present

## 2021-09-13 DIAGNOSIS — E785 Hyperlipidemia, unspecified: Secondary | ICD-10-CM | POA: Diagnosis not present

## 2021-09-13 DIAGNOSIS — E039 Hypothyroidism, unspecified: Secondary | ICD-10-CM | POA: Diagnosis not present

## 2021-09-13 DIAGNOSIS — Z7982 Long term (current) use of aspirin: Secondary | ICD-10-CM | POA: Diagnosis not present

## 2021-09-13 DIAGNOSIS — K5900 Constipation, unspecified: Secondary | ICD-10-CM | POA: Diagnosis not present

## 2021-09-13 DIAGNOSIS — E119 Type 2 diabetes mellitus without complications: Secondary | ICD-10-CM | POA: Diagnosis not present

## 2021-09-18 DIAGNOSIS — Z7982 Long term (current) use of aspirin: Secondary | ICD-10-CM | POA: Diagnosis not present

## 2021-09-18 DIAGNOSIS — I10 Essential (primary) hypertension: Secondary | ICD-10-CM | POA: Diagnosis not present

## 2021-09-18 DIAGNOSIS — S72002D Fracture of unspecified part of neck of left femur, subsequent encounter for closed fracture with routine healing: Secondary | ICD-10-CM | POA: Diagnosis not present

## 2021-09-18 DIAGNOSIS — Z7984 Long term (current) use of oral hypoglycemic drugs: Secondary | ICD-10-CM | POA: Diagnosis not present

## 2021-09-18 DIAGNOSIS — E119 Type 2 diabetes mellitus without complications: Secondary | ICD-10-CM | POA: Diagnosis not present

## 2021-09-18 DIAGNOSIS — D649 Anemia, unspecified: Secondary | ICD-10-CM | POA: Diagnosis not present

## 2021-09-18 DIAGNOSIS — K5900 Constipation, unspecified: Secondary | ICD-10-CM | POA: Diagnosis not present

## 2021-09-18 DIAGNOSIS — Z9181 History of falling: Secondary | ICD-10-CM | POA: Diagnosis not present

## 2021-09-18 DIAGNOSIS — E039 Hypothyroidism, unspecified: Secondary | ICD-10-CM | POA: Diagnosis not present

## 2021-09-18 DIAGNOSIS — Z96642 Presence of left artificial hip joint: Secondary | ICD-10-CM | POA: Diagnosis not present

## 2021-09-18 DIAGNOSIS — E785 Hyperlipidemia, unspecified: Secondary | ICD-10-CM | POA: Diagnosis not present

## 2021-09-19 DIAGNOSIS — E039 Hypothyroidism, unspecified: Secondary | ICD-10-CM | POA: Diagnosis not present

## 2021-09-19 DIAGNOSIS — K5900 Constipation, unspecified: Secondary | ICD-10-CM | POA: Diagnosis not present

## 2021-09-19 DIAGNOSIS — S72002D Fracture of unspecified part of neck of left femur, subsequent encounter for closed fracture with routine healing: Secondary | ICD-10-CM | POA: Diagnosis not present

## 2021-09-19 DIAGNOSIS — D649 Anemia, unspecified: Secondary | ICD-10-CM | POA: Diagnosis not present

## 2021-09-19 DIAGNOSIS — Z9181 History of falling: Secondary | ICD-10-CM | POA: Diagnosis not present

## 2021-09-19 DIAGNOSIS — Z7982 Long term (current) use of aspirin: Secondary | ICD-10-CM | POA: Diagnosis not present

## 2021-09-19 DIAGNOSIS — Z96642 Presence of left artificial hip joint: Secondary | ICD-10-CM | POA: Diagnosis not present

## 2021-09-19 DIAGNOSIS — E119 Type 2 diabetes mellitus without complications: Secondary | ICD-10-CM | POA: Diagnosis not present

## 2021-09-19 DIAGNOSIS — Z7984 Long term (current) use of oral hypoglycemic drugs: Secondary | ICD-10-CM | POA: Diagnosis not present

## 2021-09-19 DIAGNOSIS — I10 Essential (primary) hypertension: Secondary | ICD-10-CM | POA: Diagnosis not present

## 2021-09-19 DIAGNOSIS — E785 Hyperlipidemia, unspecified: Secondary | ICD-10-CM | POA: Diagnosis not present

## 2021-09-19 DIAGNOSIS — Z8781 Personal history of (healed) traumatic fracture: Secondary | ICD-10-CM | POA: Diagnosis not present

## 2021-09-19 DIAGNOSIS — R2689 Other abnormalities of gait and mobility: Secondary | ICD-10-CM | POA: Diagnosis not present

## 2021-09-20 DIAGNOSIS — E785 Hyperlipidemia, unspecified: Secondary | ICD-10-CM | POA: Diagnosis not present

## 2021-09-20 DIAGNOSIS — I10 Essential (primary) hypertension: Secondary | ICD-10-CM | POA: Diagnosis not present

## 2021-09-20 DIAGNOSIS — E119 Type 2 diabetes mellitus without complications: Secondary | ICD-10-CM | POA: Diagnosis not present

## 2021-09-20 DIAGNOSIS — Z7984 Long term (current) use of oral hypoglycemic drugs: Secondary | ICD-10-CM | POA: Diagnosis not present

## 2021-09-20 DIAGNOSIS — Z7982 Long term (current) use of aspirin: Secondary | ICD-10-CM | POA: Diagnosis not present

## 2021-09-20 DIAGNOSIS — K5900 Constipation, unspecified: Secondary | ICD-10-CM | POA: Diagnosis not present

## 2021-09-20 DIAGNOSIS — S72002D Fracture of unspecified part of neck of left femur, subsequent encounter for closed fracture with routine healing: Secondary | ICD-10-CM | POA: Diagnosis not present

## 2021-09-20 DIAGNOSIS — Z9181 History of falling: Secondary | ICD-10-CM | POA: Diagnosis not present

## 2021-09-20 DIAGNOSIS — Z96642 Presence of left artificial hip joint: Secondary | ICD-10-CM | POA: Diagnosis not present

## 2021-09-20 DIAGNOSIS — E039 Hypothyroidism, unspecified: Secondary | ICD-10-CM | POA: Diagnosis not present

## 2021-09-20 DIAGNOSIS — D649 Anemia, unspecified: Secondary | ICD-10-CM | POA: Diagnosis not present

## 2021-09-22 DIAGNOSIS — Z8781 Personal history of (healed) traumatic fracture: Secondary | ICD-10-CM | POA: Diagnosis not present

## 2021-09-22 DIAGNOSIS — L03116 Cellulitis of left lower limb: Secondary | ICD-10-CM | POA: Diagnosis not present

## 2021-09-22 DIAGNOSIS — R2689 Other abnormalities of gait and mobility: Secondary | ICD-10-CM | POA: Diagnosis not present

## 2021-09-25 DIAGNOSIS — E039 Hypothyroidism, unspecified: Secondary | ICD-10-CM | POA: Diagnosis not present

## 2021-09-25 DIAGNOSIS — S72002D Fracture of unspecified part of neck of left femur, subsequent encounter for closed fracture with routine healing: Secondary | ICD-10-CM | POA: Diagnosis not present

## 2021-09-26 DIAGNOSIS — Z7984 Long term (current) use of oral hypoglycemic drugs: Secondary | ICD-10-CM | POA: Diagnosis not present

## 2021-09-26 DIAGNOSIS — E039 Hypothyroidism, unspecified: Secondary | ICD-10-CM | POA: Diagnosis not present

## 2021-09-26 DIAGNOSIS — K5900 Constipation, unspecified: Secondary | ICD-10-CM | POA: Diagnosis not present

## 2021-09-26 DIAGNOSIS — Z9181 History of falling: Secondary | ICD-10-CM | POA: Diagnosis not present

## 2021-09-26 DIAGNOSIS — E785 Hyperlipidemia, unspecified: Secondary | ICD-10-CM | POA: Diagnosis not present

## 2021-09-26 DIAGNOSIS — I10 Essential (primary) hypertension: Secondary | ICD-10-CM | POA: Diagnosis not present

## 2021-09-26 DIAGNOSIS — E119 Type 2 diabetes mellitus without complications: Secondary | ICD-10-CM | POA: Diagnosis not present

## 2021-09-26 DIAGNOSIS — S72002D Fracture of unspecified part of neck of left femur, subsequent encounter for closed fracture with routine healing: Secondary | ICD-10-CM | POA: Diagnosis not present

## 2021-09-26 DIAGNOSIS — Z96642 Presence of left artificial hip joint: Secondary | ICD-10-CM | POA: Diagnosis not present

## 2021-09-26 DIAGNOSIS — Z7982 Long term (current) use of aspirin: Secondary | ICD-10-CM | POA: Diagnosis not present

## 2021-09-26 DIAGNOSIS — D649 Anemia, unspecified: Secondary | ICD-10-CM | POA: Diagnosis not present

## 2021-09-27 DIAGNOSIS — D649 Anemia, unspecified: Secondary | ICD-10-CM | POA: Diagnosis not present

## 2021-09-27 DIAGNOSIS — E039 Hypothyroidism, unspecified: Secondary | ICD-10-CM | POA: Diagnosis not present

## 2021-09-27 DIAGNOSIS — I10 Essential (primary) hypertension: Secondary | ICD-10-CM | POA: Diagnosis not present

## 2021-09-27 DIAGNOSIS — K5900 Constipation, unspecified: Secondary | ICD-10-CM | POA: Diagnosis not present

## 2021-09-27 DIAGNOSIS — Z7984 Long term (current) use of oral hypoglycemic drugs: Secondary | ICD-10-CM | POA: Diagnosis not present

## 2021-09-27 DIAGNOSIS — E119 Type 2 diabetes mellitus without complications: Secondary | ICD-10-CM | POA: Diagnosis not present

## 2021-09-27 DIAGNOSIS — Z7982 Long term (current) use of aspirin: Secondary | ICD-10-CM | POA: Diagnosis not present

## 2021-09-27 DIAGNOSIS — Z9181 History of falling: Secondary | ICD-10-CM | POA: Diagnosis not present

## 2021-09-27 DIAGNOSIS — Z96642 Presence of left artificial hip joint: Secondary | ICD-10-CM | POA: Diagnosis not present

## 2021-09-27 DIAGNOSIS — E785 Hyperlipidemia, unspecified: Secondary | ICD-10-CM | POA: Diagnosis not present

## 2021-09-27 DIAGNOSIS — S72002D Fracture of unspecified part of neck of left femur, subsequent encounter for closed fracture with routine healing: Secondary | ICD-10-CM | POA: Diagnosis not present

## 2021-09-28 DIAGNOSIS — D649 Anemia, unspecified: Secondary | ICD-10-CM | POA: Diagnosis not present

## 2021-09-28 DIAGNOSIS — Z9181 History of falling: Secondary | ICD-10-CM | POA: Diagnosis not present

## 2021-09-28 DIAGNOSIS — E119 Type 2 diabetes mellitus without complications: Secondary | ICD-10-CM | POA: Diagnosis not present

## 2021-09-28 DIAGNOSIS — Z7982 Long term (current) use of aspirin: Secondary | ICD-10-CM | POA: Diagnosis not present

## 2021-09-28 DIAGNOSIS — I10 Essential (primary) hypertension: Secondary | ICD-10-CM | POA: Diagnosis not present

## 2021-09-28 DIAGNOSIS — S72002D Fracture of unspecified part of neck of left femur, subsequent encounter for closed fracture with routine healing: Secondary | ICD-10-CM | POA: Diagnosis not present

## 2021-09-28 DIAGNOSIS — Z7984 Long term (current) use of oral hypoglycemic drugs: Secondary | ICD-10-CM | POA: Diagnosis not present

## 2021-09-28 DIAGNOSIS — E039 Hypothyroidism, unspecified: Secondary | ICD-10-CM | POA: Diagnosis not present

## 2021-09-28 DIAGNOSIS — Z96642 Presence of left artificial hip joint: Secondary | ICD-10-CM | POA: Diagnosis not present

## 2021-09-28 DIAGNOSIS — E785 Hyperlipidemia, unspecified: Secondary | ICD-10-CM | POA: Diagnosis not present

## 2021-09-28 DIAGNOSIS — K5900 Constipation, unspecified: Secondary | ICD-10-CM | POA: Diagnosis not present

## 2021-09-29 DIAGNOSIS — S72002A Fracture of unspecified part of neck of left femur, initial encounter for closed fracture: Secondary | ICD-10-CM | POA: Diagnosis not present

## 2021-10-03 DIAGNOSIS — Z96642 Presence of left artificial hip joint: Secondary | ICD-10-CM | POA: Diagnosis not present

## 2021-10-03 DIAGNOSIS — Z7984 Long term (current) use of oral hypoglycemic drugs: Secondary | ICD-10-CM | POA: Diagnosis not present

## 2021-10-03 DIAGNOSIS — S72002D Fracture of unspecified part of neck of left femur, subsequent encounter for closed fracture with routine healing: Secondary | ICD-10-CM | POA: Diagnosis not present

## 2021-10-03 DIAGNOSIS — Z7982 Long term (current) use of aspirin: Secondary | ICD-10-CM | POA: Diagnosis not present

## 2021-10-03 DIAGNOSIS — D649 Anemia, unspecified: Secondary | ICD-10-CM | POA: Diagnosis not present

## 2021-10-03 DIAGNOSIS — K5900 Constipation, unspecified: Secondary | ICD-10-CM | POA: Diagnosis not present

## 2021-10-03 DIAGNOSIS — R000 Tachycardia, unspecified: Secondary | ICD-10-CM | POA: Diagnosis not present

## 2021-10-03 DIAGNOSIS — E039 Hypothyroidism, unspecified: Secondary | ICD-10-CM | POA: Diagnosis not present

## 2021-10-03 DIAGNOSIS — Z8781 Personal history of (healed) traumatic fracture: Secondary | ICD-10-CM | POA: Diagnosis not present

## 2021-10-03 DIAGNOSIS — E119 Type 2 diabetes mellitus without complications: Secondary | ICD-10-CM | POA: Diagnosis not present

## 2021-10-03 DIAGNOSIS — Z9181 History of falling: Secondary | ICD-10-CM | POA: Diagnosis not present

## 2021-10-03 DIAGNOSIS — R2689 Other abnormalities of gait and mobility: Secondary | ICD-10-CM | POA: Diagnosis not present

## 2021-10-03 DIAGNOSIS — E785 Hyperlipidemia, unspecified: Secondary | ICD-10-CM | POA: Diagnosis not present

## 2021-10-03 DIAGNOSIS — I10 Essential (primary) hypertension: Secondary | ICD-10-CM | POA: Diagnosis not present

## 2021-10-05 DIAGNOSIS — E039 Hypothyroidism, unspecified: Secondary | ICD-10-CM | POA: Diagnosis not present

## 2021-10-05 DIAGNOSIS — Z7982 Long term (current) use of aspirin: Secondary | ICD-10-CM | POA: Diagnosis not present

## 2021-10-05 DIAGNOSIS — S72002D Fracture of unspecified part of neck of left femur, subsequent encounter for closed fracture with routine healing: Secondary | ICD-10-CM | POA: Diagnosis not present

## 2021-10-05 DIAGNOSIS — Z9181 History of falling: Secondary | ICD-10-CM | POA: Diagnosis not present

## 2021-10-05 DIAGNOSIS — E119 Type 2 diabetes mellitus without complications: Secondary | ICD-10-CM | POA: Diagnosis not present

## 2021-10-05 DIAGNOSIS — D649 Anemia, unspecified: Secondary | ICD-10-CM | POA: Diagnosis not present

## 2021-10-05 DIAGNOSIS — K5900 Constipation, unspecified: Secondary | ICD-10-CM | POA: Diagnosis not present

## 2021-10-05 DIAGNOSIS — Z96642 Presence of left artificial hip joint: Secondary | ICD-10-CM | POA: Diagnosis not present

## 2021-10-05 DIAGNOSIS — Z7984 Long term (current) use of oral hypoglycemic drugs: Secondary | ICD-10-CM | POA: Diagnosis not present

## 2021-10-05 DIAGNOSIS — I10 Essential (primary) hypertension: Secondary | ICD-10-CM | POA: Diagnosis not present

## 2021-10-05 DIAGNOSIS — E785 Hyperlipidemia, unspecified: Secondary | ICD-10-CM | POA: Diagnosis not present

## 2021-10-06 DIAGNOSIS — S72002D Fracture of unspecified part of neck of left femur, subsequent encounter for closed fracture with routine healing: Secondary | ICD-10-CM | POA: Diagnosis not present

## 2021-10-06 DIAGNOSIS — I10 Essential (primary) hypertension: Secondary | ICD-10-CM | POA: Diagnosis not present

## 2021-10-06 DIAGNOSIS — Z96642 Presence of left artificial hip joint: Secondary | ICD-10-CM | POA: Diagnosis not present

## 2021-10-06 DIAGNOSIS — K5900 Constipation, unspecified: Secondary | ICD-10-CM | POA: Diagnosis not present

## 2021-10-06 DIAGNOSIS — E119 Type 2 diabetes mellitus without complications: Secondary | ICD-10-CM | POA: Diagnosis not present

## 2021-10-06 DIAGNOSIS — D649 Anemia, unspecified: Secondary | ICD-10-CM | POA: Diagnosis not present

## 2021-10-06 DIAGNOSIS — E039 Hypothyroidism, unspecified: Secondary | ICD-10-CM | POA: Diagnosis not present

## 2021-10-06 DIAGNOSIS — Z9181 History of falling: Secondary | ICD-10-CM | POA: Diagnosis not present

## 2021-10-06 DIAGNOSIS — Z7984 Long term (current) use of oral hypoglycemic drugs: Secondary | ICD-10-CM | POA: Diagnosis not present

## 2021-10-06 DIAGNOSIS — Z7982 Long term (current) use of aspirin: Secondary | ICD-10-CM | POA: Diagnosis not present

## 2021-10-06 DIAGNOSIS — E785 Hyperlipidemia, unspecified: Secondary | ICD-10-CM | POA: Diagnosis not present

## 2021-10-09 DIAGNOSIS — D649 Anemia, unspecified: Secondary | ICD-10-CM | POA: Diagnosis not present

## 2021-10-09 DIAGNOSIS — Z7982 Long term (current) use of aspirin: Secondary | ICD-10-CM | POA: Diagnosis not present

## 2021-10-09 DIAGNOSIS — E785 Hyperlipidemia, unspecified: Secondary | ICD-10-CM | POA: Diagnosis not present

## 2021-10-09 DIAGNOSIS — S72002D Fracture of unspecified part of neck of left femur, subsequent encounter for closed fracture with routine healing: Secondary | ICD-10-CM | POA: Diagnosis not present

## 2021-10-09 DIAGNOSIS — Z7984 Long term (current) use of oral hypoglycemic drugs: Secondary | ICD-10-CM | POA: Diagnosis not present

## 2021-10-09 DIAGNOSIS — K5900 Constipation, unspecified: Secondary | ICD-10-CM | POA: Diagnosis not present

## 2021-10-09 DIAGNOSIS — E119 Type 2 diabetes mellitus without complications: Secondary | ICD-10-CM | POA: Diagnosis not present

## 2021-10-09 DIAGNOSIS — Z96642 Presence of left artificial hip joint: Secondary | ICD-10-CM | POA: Diagnosis not present

## 2021-10-09 DIAGNOSIS — E039 Hypothyroidism, unspecified: Secondary | ICD-10-CM | POA: Diagnosis not present

## 2021-10-09 DIAGNOSIS — Z9181 History of falling: Secondary | ICD-10-CM | POA: Diagnosis not present

## 2021-10-09 DIAGNOSIS — I10 Essential (primary) hypertension: Secondary | ICD-10-CM | POA: Diagnosis not present

## 2021-10-10 DIAGNOSIS — Z9181 History of falling: Secondary | ICD-10-CM | POA: Diagnosis not present

## 2021-10-10 DIAGNOSIS — Z7984 Long term (current) use of oral hypoglycemic drugs: Secondary | ICD-10-CM | POA: Diagnosis not present

## 2021-10-10 DIAGNOSIS — K5900 Constipation, unspecified: Secondary | ICD-10-CM | POA: Diagnosis not present

## 2021-10-10 DIAGNOSIS — Z7982 Long term (current) use of aspirin: Secondary | ICD-10-CM | POA: Diagnosis not present

## 2021-10-10 DIAGNOSIS — E119 Type 2 diabetes mellitus without complications: Secondary | ICD-10-CM | POA: Diagnosis not present

## 2021-10-10 DIAGNOSIS — D649 Anemia, unspecified: Secondary | ICD-10-CM | POA: Diagnosis not present

## 2021-10-10 DIAGNOSIS — S72002D Fracture of unspecified part of neck of left femur, subsequent encounter for closed fracture with routine healing: Secondary | ICD-10-CM | POA: Diagnosis not present

## 2021-10-10 DIAGNOSIS — I10 Essential (primary) hypertension: Secondary | ICD-10-CM | POA: Diagnosis not present

## 2021-10-10 DIAGNOSIS — E039 Hypothyroidism, unspecified: Secondary | ICD-10-CM | POA: Diagnosis not present

## 2021-10-10 DIAGNOSIS — Z96642 Presence of left artificial hip joint: Secondary | ICD-10-CM | POA: Diagnosis not present

## 2021-10-10 DIAGNOSIS — E785 Hyperlipidemia, unspecified: Secondary | ICD-10-CM | POA: Diagnosis not present

## 2021-10-11 DIAGNOSIS — Z7984 Long term (current) use of oral hypoglycemic drugs: Secondary | ICD-10-CM | POA: Diagnosis not present

## 2021-10-11 DIAGNOSIS — S72002D Fracture of unspecified part of neck of left femur, subsequent encounter for closed fracture with routine healing: Secondary | ICD-10-CM | POA: Diagnosis not present

## 2021-10-11 DIAGNOSIS — Z9181 History of falling: Secondary | ICD-10-CM | POA: Diagnosis not present

## 2021-10-11 DIAGNOSIS — E039 Hypothyroidism, unspecified: Secondary | ICD-10-CM | POA: Diagnosis not present

## 2021-10-11 DIAGNOSIS — K5900 Constipation, unspecified: Secondary | ICD-10-CM | POA: Diagnosis not present

## 2021-10-11 DIAGNOSIS — Z7982 Long term (current) use of aspirin: Secondary | ICD-10-CM | POA: Diagnosis not present

## 2021-10-11 DIAGNOSIS — D649 Anemia, unspecified: Secondary | ICD-10-CM | POA: Diagnosis not present

## 2021-10-11 DIAGNOSIS — E119 Type 2 diabetes mellitus without complications: Secondary | ICD-10-CM | POA: Diagnosis not present

## 2021-10-11 DIAGNOSIS — E785 Hyperlipidemia, unspecified: Secondary | ICD-10-CM | POA: Diagnosis not present

## 2021-10-11 DIAGNOSIS — Z96642 Presence of left artificial hip joint: Secondary | ICD-10-CM | POA: Diagnosis not present

## 2021-10-11 DIAGNOSIS — I10 Essential (primary) hypertension: Secondary | ICD-10-CM | POA: Diagnosis not present

## 2021-10-13 DIAGNOSIS — E039 Hypothyroidism, unspecified: Secondary | ICD-10-CM | POA: Diagnosis not present

## 2021-10-13 DIAGNOSIS — E1122 Type 2 diabetes mellitus with diabetic chronic kidney disease: Secondary | ICD-10-CM | POA: Diagnosis not present

## 2021-10-13 DIAGNOSIS — E782 Mixed hyperlipidemia: Secondary | ICD-10-CM | POA: Diagnosis not present

## 2021-10-16 DIAGNOSIS — Z9181 History of falling: Secondary | ICD-10-CM | POA: Diagnosis not present

## 2021-10-16 DIAGNOSIS — E1122 Type 2 diabetes mellitus with diabetic chronic kidney disease: Secondary | ICD-10-CM | POA: Diagnosis not present

## 2021-10-16 DIAGNOSIS — Z96642 Presence of left artificial hip joint: Secondary | ICD-10-CM | POA: Diagnosis not present

## 2021-10-16 DIAGNOSIS — I10 Essential (primary) hypertension: Secondary | ICD-10-CM | POA: Diagnosis not present

## 2021-10-16 DIAGNOSIS — Z7982 Long term (current) use of aspirin: Secondary | ICD-10-CM | POA: Diagnosis not present

## 2021-10-16 DIAGNOSIS — E785 Hyperlipidemia, unspecified: Secondary | ICD-10-CM | POA: Diagnosis not present

## 2021-10-16 DIAGNOSIS — Z7984 Long term (current) use of oral hypoglycemic drugs: Secondary | ICD-10-CM | POA: Diagnosis not present

## 2021-10-16 DIAGNOSIS — N1831 Chronic kidney disease, stage 3a: Secondary | ICD-10-CM | POA: Diagnosis not present

## 2021-10-16 DIAGNOSIS — S72002D Fracture of unspecified part of neck of left femur, subsequent encounter for closed fracture with routine healing: Secondary | ICD-10-CM | POA: Diagnosis not present

## 2021-10-16 DIAGNOSIS — D649 Anemia, unspecified: Secondary | ICD-10-CM | POA: Diagnosis not present

## 2021-10-16 DIAGNOSIS — E119 Type 2 diabetes mellitus without complications: Secondary | ICD-10-CM | POA: Diagnosis not present

## 2021-10-16 DIAGNOSIS — E038 Other specified hypothyroidism: Secondary | ICD-10-CM | POA: Diagnosis not present

## 2021-10-16 DIAGNOSIS — G301 Alzheimer's disease with late onset: Secondary | ICD-10-CM | POA: Diagnosis not present

## 2021-10-16 DIAGNOSIS — E039 Hypothyroidism, unspecified: Secondary | ICD-10-CM | POA: Diagnosis not present

## 2021-10-16 DIAGNOSIS — K5900 Constipation, unspecified: Secondary | ICD-10-CM | POA: Diagnosis not present

## 2021-10-17 DIAGNOSIS — K5900 Constipation, unspecified: Secondary | ICD-10-CM | POA: Diagnosis not present

## 2021-10-17 DIAGNOSIS — E785 Hyperlipidemia, unspecified: Secondary | ICD-10-CM | POA: Diagnosis not present

## 2021-10-17 DIAGNOSIS — I10 Essential (primary) hypertension: Secondary | ICD-10-CM | POA: Diagnosis not present

## 2021-10-17 DIAGNOSIS — Z96642 Presence of left artificial hip joint: Secondary | ICD-10-CM | POA: Diagnosis not present

## 2021-10-17 DIAGNOSIS — S72002D Fracture of unspecified part of neck of left femur, subsequent encounter for closed fracture with routine healing: Secondary | ICD-10-CM | POA: Diagnosis not present

## 2021-10-17 DIAGNOSIS — Z7984 Long term (current) use of oral hypoglycemic drugs: Secondary | ICD-10-CM | POA: Diagnosis not present

## 2021-10-17 DIAGNOSIS — D649 Anemia, unspecified: Secondary | ICD-10-CM | POA: Diagnosis not present

## 2021-10-17 DIAGNOSIS — Z7982 Long term (current) use of aspirin: Secondary | ICD-10-CM | POA: Diagnosis not present

## 2021-10-17 DIAGNOSIS — Z9181 History of falling: Secondary | ICD-10-CM | POA: Diagnosis not present

## 2021-10-17 DIAGNOSIS — E119 Type 2 diabetes mellitus without complications: Secondary | ICD-10-CM | POA: Diagnosis not present

## 2021-10-17 DIAGNOSIS — E039 Hypothyroidism, unspecified: Secondary | ICD-10-CM | POA: Diagnosis not present

## 2021-10-19 DIAGNOSIS — D649 Anemia, unspecified: Secondary | ICD-10-CM | POA: Diagnosis not present

## 2021-10-19 DIAGNOSIS — Z9181 History of falling: Secondary | ICD-10-CM | POA: Diagnosis not present

## 2021-10-19 DIAGNOSIS — I10 Essential (primary) hypertension: Secondary | ICD-10-CM | POA: Diagnosis not present

## 2021-10-19 DIAGNOSIS — Z7982 Long term (current) use of aspirin: Secondary | ICD-10-CM | POA: Diagnosis not present

## 2021-10-19 DIAGNOSIS — K5900 Constipation, unspecified: Secondary | ICD-10-CM | POA: Diagnosis not present

## 2021-10-19 DIAGNOSIS — E785 Hyperlipidemia, unspecified: Secondary | ICD-10-CM | POA: Diagnosis not present

## 2021-10-19 DIAGNOSIS — E119 Type 2 diabetes mellitus without complications: Secondary | ICD-10-CM | POA: Diagnosis not present

## 2021-10-19 DIAGNOSIS — S72002D Fracture of unspecified part of neck of left femur, subsequent encounter for closed fracture with routine healing: Secondary | ICD-10-CM | POA: Diagnosis not present

## 2021-10-19 DIAGNOSIS — Z7984 Long term (current) use of oral hypoglycemic drugs: Secondary | ICD-10-CM | POA: Diagnosis not present

## 2021-10-19 DIAGNOSIS — Z96642 Presence of left artificial hip joint: Secondary | ICD-10-CM | POA: Diagnosis not present

## 2021-10-19 DIAGNOSIS — E039 Hypothyroidism, unspecified: Secondary | ICD-10-CM | POA: Diagnosis not present

## 2021-10-24 DIAGNOSIS — Z9181 History of falling: Secondary | ICD-10-CM | POA: Diagnosis not present

## 2021-10-24 DIAGNOSIS — Z96642 Presence of left artificial hip joint: Secondary | ICD-10-CM | POA: Diagnosis not present

## 2021-10-24 DIAGNOSIS — E785 Hyperlipidemia, unspecified: Secondary | ICD-10-CM | POA: Diagnosis not present

## 2021-10-24 DIAGNOSIS — D649 Anemia, unspecified: Secondary | ICD-10-CM | POA: Diagnosis not present

## 2021-10-24 DIAGNOSIS — Z7984 Long term (current) use of oral hypoglycemic drugs: Secondary | ICD-10-CM | POA: Diagnosis not present

## 2021-10-24 DIAGNOSIS — S72002D Fracture of unspecified part of neck of left femur, subsequent encounter for closed fracture with routine healing: Secondary | ICD-10-CM | POA: Diagnosis not present

## 2021-10-24 DIAGNOSIS — K5900 Constipation, unspecified: Secondary | ICD-10-CM | POA: Diagnosis not present

## 2021-10-24 DIAGNOSIS — I10 Essential (primary) hypertension: Secondary | ICD-10-CM | POA: Diagnosis not present

## 2021-10-24 DIAGNOSIS — E039 Hypothyroidism, unspecified: Secondary | ICD-10-CM | POA: Diagnosis not present

## 2021-10-24 DIAGNOSIS — Z7982 Long term (current) use of aspirin: Secondary | ICD-10-CM | POA: Diagnosis not present

## 2021-10-24 DIAGNOSIS — E119 Type 2 diabetes mellitus without complications: Secondary | ICD-10-CM | POA: Diagnosis not present

## 2021-10-25 DIAGNOSIS — I10 Essential (primary) hypertension: Secondary | ICD-10-CM | POA: Diagnosis not present

## 2021-10-25 DIAGNOSIS — Z96642 Presence of left artificial hip joint: Secondary | ICD-10-CM | POA: Diagnosis not present

## 2021-10-25 DIAGNOSIS — Z7984 Long term (current) use of oral hypoglycemic drugs: Secondary | ICD-10-CM | POA: Diagnosis not present

## 2021-10-25 DIAGNOSIS — E785 Hyperlipidemia, unspecified: Secondary | ICD-10-CM | POA: Diagnosis not present

## 2021-10-25 DIAGNOSIS — Z9181 History of falling: Secondary | ICD-10-CM | POA: Diagnosis not present

## 2021-10-25 DIAGNOSIS — D649 Anemia, unspecified: Secondary | ICD-10-CM | POA: Diagnosis not present

## 2021-10-25 DIAGNOSIS — E119 Type 2 diabetes mellitus without complications: Secondary | ICD-10-CM | POA: Diagnosis not present

## 2021-10-25 DIAGNOSIS — S72002D Fracture of unspecified part of neck of left femur, subsequent encounter for closed fracture with routine healing: Secondary | ICD-10-CM | POA: Diagnosis not present

## 2021-10-25 DIAGNOSIS — E039 Hypothyroidism, unspecified: Secondary | ICD-10-CM | POA: Diagnosis not present

## 2021-10-25 DIAGNOSIS — Z7982 Long term (current) use of aspirin: Secondary | ICD-10-CM | POA: Diagnosis not present

## 2021-10-25 DIAGNOSIS — K5900 Constipation, unspecified: Secondary | ICD-10-CM | POA: Diagnosis not present

## 2021-10-26 DIAGNOSIS — Z96642 Presence of left artificial hip joint: Secondary | ICD-10-CM | POA: Diagnosis not present

## 2021-10-26 DIAGNOSIS — E039 Hypothyroidism, unspecified: Secondary | ICD-10-CM | POA: Diagnosis not present

## 2021-10-26 DIAGNOSIS — E785 Hyperlipidemia, unspecified: Secondary | ICD-10-CM | POA: Diagnosis not present

## 2021-10-26 DIAGNOSIS — Z7984 Long term (current) use of oral hypoglycemic drugs: Secondary | ICD-10-CM | POA: Diagnosis not present

## 2021-10-26 DIAGNOSIS — Z7982 Long term (current) use of aspirin: Secondary | ICD-10-CM | POA: Diagnosis not present

## 2021-10-26 DIAGNOSIS — D649 Anemia, unspecified: Secondary | ICD-10-CM | POA: Diagnosis not present

## 2021-10-26 DIAGNOSIS — I10 Essential (primary) hypertension: Secondary | ICD-10-CM | POA: Diagnosis not present

## 2021-10-26 DIAGNOSIS — S72002D Fracture of unspecified part of neck of left femur, subsequent encounter for closed fracture with routine healing: Secondary | ICD-10-CM | POA: Diagnosis not present

## 2021-10-26 DIAGNOSIS — E119 Type 2 diabetes mellitus without complications: Secondary | ICD-10-CM | POA: Diagnosis not present

## 2021-10-26 DIAGNOSIS — Z9181 History of falling: Secondary | ICD-10-CM | POA: Diagnosis not present

## 2021-10-26 DIAGNOSIS — K5900 Constipation, unspecified: Secondary | ICD-10-CM | POA: Diagnosis not present

## 2021-10-30 DIAGNOSIS — E039 Hypothyroidism, unspecified: Secondary | ICD-10-CM | POA: Diagnosis not present

## 2021-10-30 DIAGNOSIS — Z7984 Long term (current) use of oral hypoglycemic drugs: Secondary | ICD-10-CM | POA: Diagnosis not present

## 2021-10-30 DIAGNOSIS — D649 Anemia, unspecified: Secondary | ICD-10-CM | POA: Diagnosis not present

## 2021-10-30 DIAGNOSIS — S72002D Fracture of unspecified part of neck of left femur, subsequent encounter for closed fracture with routine healing: Secondary | ICD-10-CM | POA: Diagnosis not present

## 2021-10-30 DIAGNOSIS — Z96642 Presence of left artificial hip joint: Secondary | ICD-10-CM | POA: Diagnosis not present

## 2021-10-30 DIAGNOSIS — Z7982 Long term (current) use of aspirin: Secondary | ICD-10-CM | POA: Diagnosis not present

## 2021-10-30 DIAGNOSIS — I10 Essential (primary) hypertension: Secondary | ICD-10-CM | POA: Diagnosis not present

## 2021-10-30 DIAGNOSIS — E119 Type 2 diabetes mellitus without complications: Secondary | ICD-10-CM | POA: Diagnosis not present

## 2021-10-30 DIAGNOSIS — K5900 Constipation, unspecified: Secondary | ICD-10-CM | POA: Diagnosis not present

## 2021-10-30 DIAGNOSIS — Z9181 History of falling: Secondary | ICD-10-CM | POA: Diagnosis not present

## 2021-10-30 DIAGNOSIS — E785 Hyperlipidemia, unspecified: Secondary | ICD-10-CM | POA: Diagnosis not present

## 2021-10-31 DIAGNOSIS — Z96642 Presence of left artificial hip joint: Secondary | ICD-10-CM | POA: Diagnosis not present

## 2021-10-31 DIAGNOSIS — R300 Dysuria: Secondary | ICD-10-CM | POA: Diagnosis not present

## 2021-10-31 DIAGNOSIS — Z7982 Long term (current) use of aspirin: Secondary | ICD-10-CM | POA: Diagnosis not present

## 2021-10-31 DIAGNOSIS — K5900 Constipation, unspecified: Secondary | ICD-10-CM | POA: Diagnosis not present

## 2021-10-31 DIAGNOSIS — N1831 Chronic kidney disease, stage 3a: Secondary | ICD-10-CM | POA: Diagnosis not present

## 2021-10-31 DIAGNOSIS — E039 Hypothyroidism, unspecified: Secondary | ICD-10-CM | POA: Diagnosis not present

## 2021-10-31 DIAGNOSIS — Z7984 Long term (current) use of oral hypoglycemic drugs: Secondary | ICD-10-CM | POA: Diagnosis not present

## 2021-10-31 DIAGNOSIS — Z9181 History of falling: Secondary | ICD-10-CM | POA: Diagnosis not present

## 2021-10-31 DIAGNOSIS — D649 Anemia, unspecified: Secondary | ICD-10-CM | POA: Diagnosis not present

## 2021-10-31 DIAGNOSIS — E785 Hyperlipidemia, unspecified: Secondary | ICD-10-CM | POA: Diagnosis not present

## 2021-10-31 DIAGNOSIS — I10 Essential (primary) hypertension: Secondary | ICD-10-CM | POA: Diagnosis not present

## 2021-10-31 DIAGNOSIS — G301 Alzheimer's disease with late onset: Secondary | ICD-10-CM | POA: Diagnosis not present

## 2021-10-31 DIAGNOSIS — E119 Type 2 diabetes mellitus without complications: Secondary | ICD-10-CM | POA: Diagnosis not present

## 2021-10-31 DIAGNOSIS — S72002D Fracture of unspecified part of neck of left femur, subsequent encounter for closed fracture with routine healing: Secondary | ICD-10-CM | POA: Diagnosis not present

## 2021-11-02 DIAGNOSIS — E039 Hypothyroidism, unspecified: Secondary | ICD-10-CM | POA: Diagnosis not present

## 2021-11-02 DIAGNOSIS — S72002D Fracture of unspecified part of neck of left femur, subsequent encounter for closed fracture with routine healing: Secondary | ICD-10-CM | POA: Diagnosis not present

## 2021-11-02 DIAGNOSIS — Z7984 Long term (current) use of oral hypoglycemic drugs: Secondary | ICD-10-CM | POA: Diagnosis not present

## 2021-11-02 DIAGNOSIS — E119 Type 2 diabetes mellitus without complications: Secondary | ICD-10-CM | POA: Diagnosis not present

## 2021-11-02 DIAGNOSIS — E785 Hyperlipidemia, unspecified: Secondary | ICD-10-CM | POA: Diagnosis not present

## 2021-11-02 DIAGNOSIS — D649 Anemia, unspecified: Secondary | ICD-10-CM | POA: Diagnosis not present

## 2021-11-02 DIAGNOSIS — Z96642 Presence of left artificial hip joint: Secondary | ICD-10-CM | POA: Diagnosis not present

## 2021-11-02 DIAGNOSIS — Z7982 Long term (current) use of aspirin: Secondary | ICD-10-CM | POA: Diagnosis not present

## 2021-11-02 DIAGNOSIS — I10 Essential (primary) hypertension: Secondary | ICD-10-CM | POA: Diagnosis not present

## 2021-11-02 DIAGNOSIS — K5900 Constipation, unspecified: Secondary | ICD-10-CM | POA: Diagnosis not present

## 2021-11-02 DIAGNOSIS — Z9181 History of falling: Secondary | ICD-10-CM | POA: Diagnosis not present

## 2021-11-07 DIAGNOSIS — E039 Hypothyroidism, unspecified: Secondary | ICD-10-CM | POA: Diagnosis not present

## 2021-11-07 DIAGNOSIS — D649 Anemia, unspecified: Secondary | ICD-10-CM | POA: Diagnosis not present

## 2021-11-07 DIAGNOSIS — Z7982 Long term (current) use of aspirin: Secondary | ICD-10-CM | POA: Diagnosis not present

## 2021-11-07 DIAGNOSIS — K5900 Constipation, unspecified: Secondary | ICD-10-CM | POA: Diagnosis not present

## 2021-11-07 DIAGNOSIS — Z96642 Presence of left artificial hip joint: Secondary | ICD-10-CM | POA: Diagnosis not present

## 2021-11-07 DIAGNOSIS — Z7984 Long term (current) use of oral hypoglycemic drugs: Secondary | ICD-10-CM | POA: Diagnosis not present

## 2021-11-07 DIAGNOSIS — E119 Type 2 diabetes mellitus without complications: Secondary | ICD-10-CM | POA: Diagnosis not present

## 2021-11-07 DIAGNOSIS — I10 Essential (primary) hypertension: Secondary | ICD-10-CM | POA: Diagnosis not present

## 2021-11-07 DIAGNOSIS — S72002D Fracture of unspecified part of neck of left femur, subsequent encounter for closed fracture with routine healing: Secondary | ICD-10-CM | POA: Diagnosis not present

## 2021-11-07 DIAGNOSIS — Z9181 History of falling: Secondary | ICD-10-CM | POA: Diagnosis not present

## 2021-11-07 DIAGNOSIS — E785 Hyperlipidemia, unspecified: Secondary | ICD-10-CM | POA: Diagnosis not present

## 2021-11-09 DIAGNOSIS — Z7984 Long term (current) use of oral hypoglycemic drugs: Secondary | ICD-10-CM | POA: Diagnosis not present

## 2021-11-09 DIAGNOSIS — D649 Anemia, unspecified: Secondary | ICD-10-CM | POA: Diagnosis not present

## 2021-11-09 DIAGNOSIS — E1122 Type 2 diabetes mellitus with diabetic chronic kidney disease: Secondary | ICD-10-CM | POA: Diagnosis not present

## 2021-11-09 DIAGNOSIS — Z9181 History of falling: Secondary | ICD-10-CM | POA: Diagnosis not present

## 2021-11-09 DIAGNOSIS — Z7982 Long term (current) use of aspirin: Secondary | ICD-10-CM | POA: Diagnosis not present

## 2021-11-09 DIAGNOSIS — G301 Alzheimer's disease with late onset: Secondary | ICD-10-CM | POA: Diagnosis not present

## 2021-11-09 DIAGNOSIS — Z96642 Presence of left artificial hip joint: Secondary | ICD-10-CM | POA: Diagnosis not present

## 2021-11-09 DIAGNOSIS — K5900 Constipation, unspecified: Secondary | ICD-10-CM | POA: Diagnosis not present

## 2021-11-09 DIAGNOSIS — S72002D Fracture of unspecified part of neck of left femur, subsequent encounter for closed fracture with routine healing: Secondary | ICD-10-CM | POA: Diagnosis not present

## 2021-11-09 DIAGNOSIS — E039 Hypothyroidism, unspecified: Secondary | ICD-10-CM | POA: Diagnosis not present

## 2021-11-09 DIAGNOSIS — E785 Hyperlipidemia, unspecified: Secondary | ICD-10-CM | POA: Diagnosis not present

## 2021-11-09 DIAGNOSIS — I10 Essential (primary) hypertension: Secondary | ICD-10-CM | POA: Diagnosis not present

## 2021-11-09 DIAGNOSIS — E119 Type 2 diabetes mellitus without complications: Secondary | ICD-10-CM | POA: Diagnosis not present

## 2021-11-10 DIAGNOSIS — E038 Other specified hypothyroidism: Secondary | ICD-10-CM | POA: Diagnosis not present

## 2021-11-10 DIAGNOSIS — E782 Mixed hyperlipidemia: Secondary | ICD-10-CM | POA: Diagnosis not present

## 2021-11-10 DIAGNOSIS — E1122 Type 2 diabetes mellitus with diabetic chronic kidney disease: Secondary | ICD-10-CM | POA: Diagnosis not present

## 2021-11-10 DIAGNOSIS — E039 Hypothyroidism, unspecified: Secondary | ICD-10-CM | POA: Diagnosis not present

## 2021-11-14 DIAGNOSIS — Z9181 History of falling: Secondary | ICD-10-CM | POA: Diagnosis not present

## 2021-11-14 DIAGNOSIS — E785 Hyperlipidemia, unspecified: Secondary | ICD-10-CM | POA: Diagnosis not present

## 2021-11-14 DIAGNOSIS — Z7982 Long term (current) use of aspirin: Secondary | ICD-10-CM | POA: Diagnosis not present

## 2021-11-14 DIAGNOSIS — I10 Essential (primary) hypertension: Secondary | ICD-10-CM | POA: Diagnosis not present

## 2021-11-14 DIAGNOSIS — E119 Type 2 diabetes mellitus without complications: Secondary | ICD-10-CM | POA: Diagnosis not present

## 2021-11-14 DIAGNOSIS — E039 Hypothyroidism, unspecified: Secondary | ICD-10-CM | POA: Diagnosis not present

## 2021-11-14 DIAGNOSIS — Z7984 Long term (current) use of oral hypoglycemic drugs: Secondary | ICD-10-CM | POA: Diagnosis not present

## 2021-11-14 DIAGNOSIS — D649 Anemia, unspecified: Secondary | ICD-10-CM | POA: Diagnosis not present

## 2021-11-14 DIAGNOSIS — S72002D Fracture of unspecified part of neck of left femur, subsequent encounter for closed fracture with routine healing: Secondary | ICD-10-CM | POA: Diagnosis not present

## 2021-11-14 DIAGNOSIS — Z96642 Presence of left artificial hip joint: Secondary | ICD-10-CM | POA: Diagnosis not present

## 2021-11-14 DIAGNOSIS — K5900 Constipation, unspecified: Secondary | ICD-10-CM | POA: Diagnosis not present

## 2021-11-17 DIAGNOSIS — Z9181 History of falling: Secondary | ICD-10-CM | POA: Diagnosis not present

## 2021-11-17 DIAGNOSIS — K5900 Constipation, unspecified: Secondary | ICD-10-CM | POA: Diagnosis not present

## 2021-11-17 DIAGNOSIS — E785 Hyperlipidemia, unspecified: Secondary | ICD-10-CM | POA: Diagnosis not present

## 2021-11-17 DIAGNOSIS — E119 Type 2 diabetes mellitus without complications: Secondary | ICD-10-CM | POA: Diagnosis not present

## 2021-11-17 DIAGNOSIS — I10 Essential (primary) hypertension: Secondary | ICD-10-CM | POA: Diagnosis not present

## 2021-11-17 DIAGNOSIS — D649 Anemia, unspecified: Secondary | ICD-10-CM | POA: Diagnosis not present

## 2021-11-17 DIAGNOSIS — S72002D Fracture of unspecified part of neck of left femur, subsequent encounter for closed fracture with routine healing: Secondary | ICD-10-CM | POA: Diagnosis not present

## 2021-11-17 DIAGNOSIS — Z7984 Long term (current) use of oral hypoglycemic drugs: Secondary | ICD-10-CM | POA: Diagnosis not present

## 2021-11-17 DIAGNOSIS — Z7982 Long term (current) use of aspirin: Secondary | ICD-10-CM | POA: Diagnosis not present

## 2021-11-17 DIAGNOSIS — E039 Hypothyroidism, unspecified: Secondary | ICD-10-CM | POA: Diagnosis not present

## 2021-11-17 DIAGNOSIS — Z96642 Presence of left artificial hip joint: Secondary | ICD-10-CM | POA: Diagnosis not present

## 2021-11-20 DIAGNOSIS — Z9181 History of falling: Secondary | ICD-10-CM | POA: Diagnosis not present

## 2021-11-20 DIAGNOSIS — E039 Hypothyroidism, unspecified: Secondary | ICD-10-CM | POA: Diagnosis not present

## 2021-11-20 DIAGNOSIS — Z7982 Long term (current) use of aspirin: Secondary | ICD-10-CM | POA: Diagnosis not present

## 2021-11-20 DIAGNOSIS — S72002D Fracture of unspecified part of neck of left femur, subsequent encounter for closed fracture with routine healing: Secondary | ICD-10-CM | POA: Diagnosis not present

## 2021-11-20 DIAGNOSIS — Z7984 Long term (current) use of oral hypoglycemic drugs: Secondary | ICD-10-CM | POA: Diagnosis not present

## 2021-11-20 DIAGNOSIS — E119 Type 2 diabetes mellitus without complications: Secondary | ICD-10-CM | POA: Diagnosis not present

## 2021-11-20 DIAGNOSIS — D649 Anemia, unspecified: Secondary | ICD-10-CM | POA: Diagnosis not present

## 2021-11-20 DIAGNOSIS — I10 Essential (primary) hypertension: Secondary | ICD-10-CM | POA: Diagnosis not present

## 2021-11-20 DIAGNOSIS — Z96642 Presence of left artificial hip joint: Secondary | ICD-10-CM | POA: Diagnosis not present

## 2021-11-20 DIAGNOSIS — K5900 Constipation, unspecified: Secondary | ICD-10-CM | POA: Diagnosis not present

## 2021-11-20 DIAGNOSIS — E785 Hyperlipidemia, unspecified: Secondary | ICD-10-CM | POA: Diagnosis not present

## 2021-11-21 DIAGNOSIS — E785 Hyperlipidemia, unspecified: Secondary | ICD-10-CM | POA: Diagnosis not present

## 2021-11-21 DIAGNOSIS — I10 Essential (primary) hypertension: Secondary | ICD-10-CM | POA: Diagnosis not present

## 2021-11-21 DIAGNOSIS — Z7984 Long term (current) use of oral hypoglycemic drugs: Secondary | ICD-10-CM | POA: Diagnosis not present

## 2021-11-21 DIAGNOSIS — E119 Type 2 diabetes mellitus without complications: Secondary | ICD-10-CM | POA: Diagnosis not present

## 2021-11-21 DIAGNOSIS — Z96642 Presence of left artificial hip joint: Secondary | ICD-10-CM | POA: Diagnosis not present

## 2021-11-21 DIAGNOSIS — S72002D Fracture of unspecified part of neck of left femur, subsequent encounter for closed fracture with routine healing: Secondary | ICD-10-CM | POA: Diagnosis not present

## 2021-11-21 DIAGNOSIS — Z7982 Long term (current) use of aspirin: Secondary | ICD-10-CM | POA: Diagnosis not present

## 2021-11-21 DIAGNOSIS — E039 Hypothyroidism, unspecified: Secondary | ICD-10-CM | POA: Diagnosis not present

## 2021-11-21 DIAGNOSIS — D649 Anemia, unspecified: Secondary | ICD-10-CM | POA: Diagnosis not present

## 2021-11-21 DIAGNOSIS — Z9181 History of falling: Secondary | ICD-10-CM | POA: Diagnosis not present

## 2021-11-21 DIAGNOSIS — K5900 Constipation, unspecified: Secondary | ICD-10-CM | POA: Diagnosis not present

## 2021-11-23 DIAGNOSIS — Z96642 Presence of left artificial hip joint: Secondary | ICD-10-CM | POA: Diagnosis not present

## 2021-11-23 DIAGNOSIS — Z7982 Long term (current) use of aspirin: Secondary | ICD-10-CM | POA: Diagnosis not present

## 2021-11-23 DIAGNOSIS — K5900 Constipation, unspecified: Secondary | ICD-10-CM | POA: Diagnosis not present

## 2021-11-23 DIAGNOSIS — Z7984 Long term (current) use of oral hypoglycemic drugs: Secondary | ICD-10-CM | POA: Diagnosis not present

## 2021-11-23 DIAGNOSIS — E039 Hypothyroidism, unspecified: Secondary | ICD-10-CM | POA: Diagnosis not present

## 2021-11-23 DIAGNOSIS — E119 Type 2 diabetes mellitus without complications: Secondary | ICD-10-CM | POA: Diagnosis not present

## 2021-11-23 DIAGNOSIS — Z9181 History of falling: Secondary | ICD-10-CM | POA: Diagnosis not present

## 2021-11-23 DIAGNOSIS — S72002D Fracture of unspecified part of neck of left femur, subsequent encounter for closed fracture with routine healing: Secondary | ICD-10-CM | POA: Diagnosis not present

## 2021-11-23 DIAGNOSIS — I10 Essential (primary) hypertension: Secondary | ICD-10-CM | POA: Diagnosis not present

## 2021-11-23 DIAGNOSIS — D649 Anemia, unspecified: Secondary | ICD-10-CM | POA: Diagnosis not present

## 2021-11-23 DIAGNOSIS — E785 Hyperlipidemia, unspecified: Secondary | ICD-10-CM | POA: Diagnosis not present

## 2021-11-27 DIAGNOSIS — D649 Anemia, unspecified: Secondary | ICD-10-CM | POA: Diagnosis not present

## 2021-11-27 DIAGNOSIS — Z9181 History of falling: Secondary | ICD-10-CM | POA: Diagnosis not present

## 2021-11-27 DIAGNOSIS — I10 Essential (primary) hypertension: Secondary | ICD-10-CM | POA: Diagnosis not present

## 2021-11-27 DIAGNOSIS — E785 Hyperlipidemia, unspecified: Secondary | ICD-10-CM | POA: Diagnosis not present

## 2021-11-27 DIAGNOSIS — E119 Type 2 diabetes mellitus without complications: Secondary | ICD-10-CM | POA: Diagnosis not present

## 2021-11-27 DIAGNOSIS — Z7984 Long term (current) use of oral hypoglycemic drugs: Secondary | ICD-10-CM | POA: Diagnosis not present

## 2021-11-27 DIAGNOSIS — Z7982 Long term (current) use of aspirin: Secondary | ICD-10-CM | POA: Diagnosis not present

## 2021-11-27 DIAGNOSIS — K5900 Constipation, unspecified: Secondary | ICD-10-CM | POA: Diagnosis not present

## 2021-11-27 DIAGNOSIS — Z96642 Presence of left artificial hip joint: Secondary | ICD-10-CM | POA: Diagnosis not present

## 2021-11-27 DIAGNOSIS — E039 Hypothyroidism, unspecified: Secondary | ICD-10-CM | POA: Diagnosis not present

## 2021-11-27 DIAGNOSIS — S72002D Fracture of unspecified part of neck of left femur, subsequent encounter for closed fracture with routine healing: Secondary | ICD-10-CM | POA: Diagnosis not present

## 2021-11-28 DIAGNOSIS — E785 Hyperlipidemia, unspecified: Secondary | ICD-10-CM | POA: Diagnosis not present

## 2021-11-28 DIAGNOSIS — E119 Type 2 diabetes mellitus without complications: Secondary | ICD-10-CM | POA: Diagnosis not present

## 2021-11-28 DIAGNOSIS — Z96642 Presence of left artificial hip joint: Secondary | ICD-10-CM | POA: Diagnosis not present

## 2021-11-28 DIAGNOSIS — Z7984 Long term (current) use of oral hypoglycemic drugs: Secondary | ICD-10-CM | POA: Diagnosis not present

## 2021-11-28 DIAGNOSIS — I10 Essential (primary) hypertension: Secondary | ICD-10-CM | POA: Diagnosis not present

## 2021-11-28 DIAGNOSIS — E039 Hypothyroidism, unspecified: Secondary | ICD-10-CM | POA: Diagnosis not present

## 2021-11-28 DIAGNOSIS — K5900 Constipation, unspecified: Secondary | ICD-10-CM | POA: Diagnosis not present

## 2021-11-28 DIAGNOSIS — D649 Anemia, unspecified: Secondary | ICD-10-CM | POA: Diagnosis not present

## 2021-11-28 DIAGNOSIS — S72002D Fracture of unspecified part of neck of left femur, subsequent encounter for closed fracture with routine healing: Secondary | ICD-10-CM | POA: Diagnosis not present

## 2021-11-28 DIAGNOSIS — Z9181 History of falling: Secondary | ICD-10-CM | POA: Diagnosis not present

## 2021-11-28 DIAGNOSIS — Z7982 Long term (current) use of aspirin: Secondary | ICD-10-CM | POA: Diagnosis not present

## 2021-11-30 DIAGNOSIS — K5900 Constipation, unspecified: Secondary | ICD-10-CM | POA: Diagnosis not present

## 2021-11-30 DIAGNOSIS — E119 Type 2 diabetes mellitus without complications: Secondary | ICD-10-CM | POA: Diagnosis not present

## 2021-11-30 DIAGNOSIS — Z7982 Long term (current) use of aspirin: Secondary | ICD-10-CM | POA: Diagnosis not present

## 2021-11-30 DIAGNOSIS — Z7984 Long term (current) use of oral hypoglycemic drugs: Secondary | ICD-10-CM | POA: Diagnosis not present

## 2021-11-30 DIAGNOSIS — I10 Essential (primary) hypertension: Secondary | ICD-10-CM | POA: Diagnosis not present

## 2021-11-30 DIAGNOSIS — Z9181 History of falling: Secondary | ICD-10-CM | POA: Diagnosis not present

## 2021-11-30 DIAGNOSIS — E785 Hyperlipidemia, unspecified: Secondary | ICD-10-CM | POA: Diagnosis not present

## 2021-11-30 DIAGNOSIS — E039 Hypothyroidism, unspecified: Secondary | ICD-10-CM | POA: Diagnosis not present

## 2021-11-30 DIAGNOSIS — D649 Anemia, unspecified: Secondary | ICD-10-CM | POA: Diagnosis not present

## 2021-11-30 DIAGNOSIS — Z96642 Presence of left artificial hip joint: Secondary | ICD-10-CM | POA: Diagnosis not present

## 2021-11-30 DIAGNOSIS — S72002D Fracture of unspecified part of neck of left femur, subsequent encounter for closed fracture with routine healing: Secondary | ICD-10-CM | POA: Diagnosis not present

## 2021-12-05 DIAGNOSIS — Z96642 Presence of left artificial hip joint: Secondary | ICD-10-CM | POA: Diagnosis not present

## 2021-12-05 DIAGNOSIS — E785 Hyperlipidemia, unspecified: Secondary | ICD-10-CM | POA: Diagnosis not present

## 2021-12-05 DIAGNOSIS — Z7984 Long term (current) use of oral hypoglycemic drugs: Secondary | ICD-10-CM | POA: Diagnosis not present

## 2021-12-05 DIAGNOSIS — S72002D Fracture of unspecified part of neck of left femur, subsequent encounter for closed fracture with routine healing: Secondary | ICD-10-CM | POA: Diagnosis not present

## 2021-12-05 DIAGNOSIS — D649 Anemia, unspecified: Secondary | ICD-10-CM | POA: Diagnosis not present

## 2021-12-05 DIAGNOSIS — E119 Type 2 diabetes mellitus without complications: Secondary | ICD-10-CM | POA: Diagnosis not present

## 2021-12-05 DIAGNOSIS — I10 Essential (primary) hypertension: Secondary | ICD-10-CM | POA: Diagnosis not present

## 2021-12-05 DIAGNOSIS — K5900 Constipation, unspecified: Secondary | ICD-10-CM | POA: Diagnosis not present

## 2021-12-05 DIAGNOSIS — E039 Hypothyroidism, unspecified: Secondary | ICD-10-CM | POA: Diagnosis not present

## 2021-12-05 DIAGNOSIS — Z7982 Long term (current) use of aspirin: Secondary | ICD-10-CM | POA: Diagnosis not present

## 2021-12-05 DIAGNOSIS — Z9181 History of falling: Secondary | ICD-10-CM | POA: Diagnosis not present

## 2021-12-06 DIAGNOSIS — D649 Anemia, unspecified: Secondary | ICD-10-CM | POA: Diagnosis not present

## 2021-12-06 DIAGNOSIS — Z7982 Long term (current) use of aspirin: Secondary | ICD-10-CM | POA: Diagnosis not present

## 2021-12-06 DIAGNOSIS — Z9181 History of falling: Secondary | ICD-10-CM | POA: Diagnosis not present

## 2021-12-06 DIAGNOSIS — K5900 Constipation, unspecified: Secondary | ICD-10-CM | POA: Diagnosis not present

## 2021-12-06 DIAGNOSIS — E119 Type 2 diabetes mellitus without complications: Secondary | ICD-10-CM | POA: Diagnosis not present

## 2021-12-06 DIAGNOSIS — Z7984 Long term (current) use of oral hypoglycemic drugs: Secondary | ICD-10-CM | POA: Diagnosis not present

## 2021-12-06 DIAGNOSIS — I10 Essential (primary) hypertension: Secondary | ICD-10-CM | POA: Diagnosis not present

## 2021-12-06 DIAGNOSIS — Z96642 Presence of left artificial hip joint: Secondary | ICD-10-CM | POA: Diagnosis not present

## 2021-12-06 DIAGNOSIS — S72002D Fracture of unspecified part of neck of left femur, subsequent encounter for closed fracture with routine healing: Secondary | ICD-10-CM | POA: Diagnosis not present

## 2021-12-06 DIAGNOSIS — E785 Hyperlipidemia, unspecified: Secondary | ICD-10-CM | POA: Diagnosis not present

## 2021-12-06 DIAGNOSIS — E039 Hypothyroidism, unspecified: Secondary | ICD-10-CM | POA: Diagnosis not present

## 2021-12-07 DIAGNOSIS — R300 Dysuria: Secondary | ICD-10-CM | POA: Diagnosis not present

## 2021-12-08 DIAGNOSIS — N390 Urinary tract infection, site not specified: Secondary | ICD-10-CM | POA: Diagnosis not present

## 2021-12-08 DIAGNOSIS — G301 Alzheimer's disease with late onset: Secondary | ICD-10-CM | POA: Diagnosis not present

## 2021-12-08 DIAGNOSIS — R5383 Other fatigue: Secondary | ICD-10-CM | POA: Diagnosis not present

## 2021-12-12 DIAGNOSIS — E039 Hypothyroidism, unspecified: Secondary | ICD-10-CM | POA: Diagnosis not present

## 2021-12-12 DIAGNOSIS — D649 Anemia, unspecified: Secondary | ICD-10-CM | POA: Diagnosis not present

## 2021-12-12 DIAGNOSIS — Z96642 Presence of left artificial hip joint: Secondary | ICD-10-CM | POA: Diagnosis not present

## 2021-12-12 DIAGNOSIS — K5900 Constipation, unspecified: Secondary | ICD-10-CM | POA: Diagnosis not present

## 2021-12-12 DIAGNOSIS — E785 Hyperlipidemia, unspecified: Secondary | ICD-10-CM | POA: Diagnosis not present

## 2021-12-12 DIAGNOSIS — Z9181 History of falling: Secondary | ICD-10-CM | POA: Diagnosis not present

## 2021-12-12 DIAGNOSIS — E119 Type 2 diabetes mellitus without complications: Secondary | ICD-10-CM | POA: Diagnosis not present

## 2021-12-12 DIAGNOSIS — S72002D Fracture of unspecified part of neck of left femur, subsequent encounter for closed fracture with routine healing: Secondary | ICD-10-CM | POA: Diagnosis not present

## 2021-12-12 DIAGNOSIS — I10 Essential (primary) hypertension: Secondary | ICD-10-CM | POA: Diagnosis not present

## 2021-12-12 DIAGNOSIS — Z7982 Long term (current) use of aspirin: Secondary | ICD-10-CM | POA: Diagnosis not present

## 2021-12-12 DIAGNOSIS — Z7984 Long term (current) use of oral hypoglycemic drugs: Secondary | ICD-10-CM | POA: Diagnosis not present

## 2021-12-13 DIAGNOSIS — E039 Hypothyroidism, unspecified: Secondary | ICD-10-CM | POA: Diagnosis not present

## 2021-12-13 DIAGNOSIS — D649 Anemia, unspecified: Secondary | ICD-10-CM | POA: Diagnosis not present

## 2021-12-13 DIAGNOSIS — S72002D Fracture of unspecified part of neck of left femur, subsequent encounter for closed fracture with routine healing: Secondary | ICD-10-CM | POA: Diagnosis not present

## 2021-12-13 DIAGNOSIS — E119 Type 2 diabetes mellitus without complications: Secondary | ICD-10-CM | POA: Diagnosis not present

## 2021-12-13 DIAGNOSIS — Z7982 Long term (current) use of aspirin: Secondary | ICD-10-CM | POA: Diagnosis not present

## 2021-12-13 DIAGNOSIS — I10 Essential (primary) hypertension: Secondary | ICD-10-CM | POA: Diagnosis not present

## 2021-12-13 DIAGNOSIS — Z96642 Presence of left artificial hip joint: Secondary | ICD-10-CM | POA: Diagnosis not present

## 2021-12-13 DIAGNOSIS — K5900 Constipation, unspecified: Secondary | ICD-10-CM | POA: Diagnosis not present

## 2021-12-13 DIAGNOSIS — Z7984 Long term (current) use of oral hypoglycemic drugs: Secondary | ICD-10-CM | POA: Diagnosis not present

## 2021-12-13 DIAGNOSIS — Z9181 History of falling: Secondary | ICD-10-CM | POA: Diagnosis not present

## 2021-12-13 DIAGNOSIS — E785 Hyperlipidemia, unspecified: Secondary | ICD-10-CM | POA: Diagnosis not present

## 2021-12-19 DIAGNOSIS — E785 Hyperlipidemia, unspecified: Secondary | ICD-10-CM | POA: Diagnosis not present

## 2021-12-19 DIAGNOSIS — Z96642 Presence of left artificial hip joint: Secondary | ICD-10-CM | POA: Diagnosis not present

## 2021-12-19 DIAGNOSIS — K5900 Constipation, unspecified: Secondary | ICD-10-CM | POA: Diagnosis not present

## 2021-12-19 DIAGNOSIS — I10 Essential (primary) hypertension: Secondary | ICD-10-CM | POA: Diagnosis not present

## 2021-12-19 DIAGNOSIS — Z7984 Long term (current) use of oral hypoglycemic drugs: Secondary | ICD-10-CM | POA: Diagnosis not present

## 2021-12-19 DIAGNOSIS — Z9181 History of falling: Secondary | ICD-10-CM | POA: Diagnosis not present

## 2021-12-19 DIAGNOSIS — E119 Type 2 diabetes mellitus without complications: Secondary | ICD-10-CM | POA: Diagnosis not present

## 2021-12-19 DIAGNOSIS — Z7982 Long term (current) use of aspirin: Secondary | ICD-10-CM | POA: Diagnosis not present

## 2021-12-19 DIAGNOSIS — S72002D Fracture of unspecified part of neck of left femur, subsequent encounter for closed fracture with routine healing: Secondary | ICD-10-CM | POA: Diagnosis not present

## 2021-12-19 DIAGNOSIS — E039 Hypothyroidism, unspecified: Secondary | ICD-10-CM | POA: Diagnosis not present

## 2021-12-19 DIAGNOSIS — D649 Anemia, unspecified: Secondary | ICD-10-CM | POA: Diagnosis not present

## 2021-12-20 DIAGNOSIS — E039 Hypothyroidism, unspecified: Secondary | ICD-10-CM | POA: Diagnosis not present

## 2021-12-20 DIAGNOSIS — E785 Hyperlipidemia, unspecified: Secondary | ICD-10-CM | POA: Diagnosis not present

## 2021-12-20 DIAGNOSIS — D649 Anemia, unspecified: Secondary | ICD-10-CM | POA: Diagnosis not present

## 2021-12-20 DIAGNOSIS — I10 Essential (primary) hypertension: Secondary | ICD-10-CM | POA: Diagnosis not present

## 2021-12-20 DIAGNOSIS — S72002D Fracture of unspecified part of neck of left femur, subsequent encounter for closed fracture with routine healing: Secondary | ICD-10-CM | POA: Diagnosis not present

## 2021-12-20 DIAGNOSIS — Z96642 Presence of left artificial hip joint: Secondary | ICD-10-CM | POA: Diagnosis not present

## 2021-12-20 DIAGNOSIS — Z7982 Long term (current) use of aspirin: Secondary | ICD-10-CM | POA: Diagnosis not present

## 2021-12-20 DIAGNOSIS — Z9181 History of falling: Secondary | ICD-10-CM | POA: Diagnosis not present

## 2021-12-20 DIAGNOSIS — E119 Type 2 diabetes mellitus without complications: Secondary | ICD-10-CM | POA: Diagnosis not present

## 2021-12-20 DIAGNOSIS — Z7984 Long term (current) use of oral hypoglycemic drugs: Secondary | ICD-10-CM | POA: Diagnosis not present

## 2021-12-20 DIAGNOSIS — K5900 Constipation, unspecified: Secondary | ICD-10-CM | POA: Diagnosis not present

## 2021-12-22 DIAGNOSIS — Z9181 History of falling: Secondary | ICD-10-CM | POA: Diagnosis not present

## 2021-12-22 DIAGNOSIS — E785 Hyperlipidemia, unspecified: Secondary | ICD-10-CM | POA: Diagnosis not present

## 2021-12-22 DIAGNOSIS — Z96642 Presence of left artificial hip joint: Secondary | ICD-10-CM | POA: Diagnosis not present

## 2021-12-22 DIAGNOSIS — D649 Anemia, unspecified: Secondary | ICD-10-CM | POA: Diagnosis not present

## 2021-12-22 DIAGNOSIS — Z7984 Long term (current) use of oral hypoglycemic drugs: Secondary | ICD-10-CM | POA: Diagnosis not present

## 2021-12-22 DIAGNOSIS — E039 Hypothyroidism, unspecified: Secondary | ICD-10-CM | POA: Diagnosis not present

## 2021-12-22 DIAGNOSIS — I10 Essential (primary) hypertension: Secondary | ICD-10-CM | POA: Diagnosis not present

## 2021-12-22 DIAGNOSIS — K5900 Constipation, unspecified: Secondary | ICD-10-CM | POA: Diagnosis not present

## 2021-12-22 DIAGNOSIS — E119 Type 2 diabetes mellitus without complications: Secondary | ICD-10-CM | POA: Diagnosis not present

## 2021-12-22 DIAGNOSIS — Z7982 Long term (current) use of aspirin: Secondary | ICD-10-CM | POA: Diagnosis not present

## 2021-12-22 DIAGNOSIS — S72002D Fracture of unspecified part of neck of left femur, subsequent encounter for closed fracture with routine healing: Secondary | ICD-10-CM | POA: Diagnosis not present

## 2021-12-26 DIAGNOSIS — Z7982 Long term (current) use of aspirin: Secondary | ICD-10-CM | POA: Diagnosis not present

## 2021-12-26 DIAGNOSIS — Z96642 Presence of left artificial hip joint: Secondary | ICD-10-CM | POA: Diagnosis not present

## 2021-12-26 DIAGNOSIS — S72002D Fracture of unspecified part of neck of left femur, subsequent encounter for closed fracture with routine healing: Secondary | ICD-10-CM | POA: Diagnosis not present

## 2021-12-26 DIAGNOSIS — K5900 Constipation, unspecified: Secondary | ICD-10-CM | POA: Diagnosis not present

## 2021-12-26 DIAGNOSIS — E785 Hyperlipidemia, unspecified: Secondary | ICD-10-CM | POA: Diagnosis not present

## 2021-12-26 DIAGNOSIS — Z7984 Long term (current) use of oral hypoglycemic drugs: Secondary | ICD-10-CM | POA: Diagnosis not present

## 2021-12-26 DIAGNOSIS — E039 Hypothyroidism, unspecified: Secondary | ICD-10-CM | POA: Diagnosis not present

## 2021-12-26 DIAGNOSIS — D649 Anemia, unspecified: Secondary | ICD-10-CM | POA: Diagnosis not present

## 2021-12-26 DIAGNOSIS — E119 Type 2 diabetes mellitus without complications: Secondary | ICD-10-CM | POA: Diagnosis not present

## 2021-12-26 DIAGNOSIS — I10 Essential (primary) hypertension: Secondary | ICD-10-CM | POA: Diagnosis not present

## 2021-12-26 DIAGNOSIS — Z9181 History of falling: Secondary | ICD-10-CM | POA: Diagnosis not present

## 2021-12-27 DIAGNOSIS — E119 Type 2 diabetes mellitus without complications: Secondary | ICD-10-CM | POA: Diagnosis not present

## 2021-12-27 DIAGNOSIS — Z7982 Long term (current) use of aspirin: Secondary | ICD-10-CM | POA: Diagnosis not present

## 2021-12-27 DIAGNOSIS — S72002D Fracture of unspecified part of neck of left femur, subsequent encounter for closed fracture with routine healing: Secondary | ICD-10-CM | POA: Diagnosis not present

## 2021-12-27 DIAGNOSIS — E039 Hypothyroidism, unspecified: Secondary | ICD-10-CM | POA: Diagnosis not present

## 2021-12-27 DIAGNOSIS — E785 Hyperlipidemia, unspecified: Secondary | ICD-10-CM | POA: Diagnosis not present

## 2021-12-27 DIAGNOSIS — K5900 Constipation, unspecified: Secondary | ICD-10-CM | POA: Diagnosis not present

## 2021-12-27 DIAGNOSIS — I10 Essential (primary) hypertension: Secondary | ICD-10-CM | POA: Diagnosis not present

## 2021-12-27 DIAGNOSIS — Z9181 History of falling: Secondary | ICD-10-CM | POA: Diagnosis not present

## 2021-12-27 DIAGNOSIS — Z7984 Long term (current) use of oral hypoglycemic drugs: Secondary | ICD-10-CM | POA: Diagnosis not present

## 2021-12-27 DIAGNOSIS — D649 Anemia, unspecified: Secondary | ICD-10-CM | POA: Diagnosis not present

## 2021-12-27 DIAGNOSIS — Z96642 Presence of left artificial hip joint: Secondary | ICD-10-CM | POA: Diagnosis not present

## 2022-01-02 DIAGNOSIS — E039 Hypothyroidism, unspecified: Secondary | ICD-10-CM | POA: Diagnosis not present

## 2022-01-02 DIAGNOSIS — I10 Essential (primary) hypertension: Secondary | ICD-10-CM | POA: Diagnosis not present

## 2022-01-02 DIAGNOSIS — Z7982 Long term (current) use of aspirin: Secondary | ICD-10-CM | POA: Diagnosis not present

## 2022-01-02 DIAGNOSIS — Z7984 Long term (current) use of oral hypoglycemic drugs: Secondary | ICD-10-CM | POA: Diagnosis not present

## 2022-01-02 DIAGNOSIS — D649 Anemia, unspecified: Secondary | ICD-10-CM | POA: Diagnosis not present

## 2022-01-02 DIAGNOSIS — E785 Hyperlipidemia, unspecified: Secondary | ICD-10-CM | POA: Diagnosis not present

## 2022-01-02 DIAGNOSIS — Z96642 Presence of left artificial hip joint: Secondary | ICD-10-CM | POA: Diagnosis not present

## 2022-01-02 DIAGNOSIS — K5900 Constipation, unspecified: Secondary | ICD-10-CM | POA: Diagnosis not present

## 2022-01-02 DIAGNOSIS — E119 Type 2 diabetes mellitus without complications: Secondary | ICD-10-CM | POA: Diagnosis not present

## 2022-01-02 DIAGNOSIS — Z9181 History of falling: Secondary | ICD-10-CM | POA: Diagnosis not present

## 2022-01-02 DIAGNOSIS — S72002D Fracture of unspecified part of neck of left femur, subsequent encounter for closed fracture with routine healing: Secondary | ICD-10-CM | POA: Diagnosis not present

## 2022-01-03 DIAGNOSIS — G301 Alzheimer's disease with late onset: Secondary | ICD-10-CM | POA: Diagnosis not present

## 2022-01-03 DIAGNOSIS — E782 Mixed hyperlipidemia: Secondary | ICD-10-CM | POA: Diagnosis not present

## 2022-01-03 DIAGNOSIS — N1831 Chronic kidney disease, stage 3a: Secondary | ICD-10-CM | POA: Diagnosis not present

## 2022-01-03 DIAGNOSIS — E1122 Type 2 diabetes mellitus with diabetic chronic kidney disease: Secondary | ICD-10-CM | POA: Diagnosis not present

## 2022-01-03 DIAGNOSIS — E038 Other specified hypothyroidism: Secondary | ICD-10-CM | POA: Diagnosis not present

## 2022-01-04 DIAGNOSIS — Z7982 Long term (current) use of aspirin: Secondary | ICD-10-CM | POA: Diagnosis not present

## 2022-01-04 DIAGNOSIS — Z7984 Long term (current) use of oral hypoglycemic drugs: Secondary | ICD-10-CM | POA: Diagnosis not present

## 2022-01-04 DIAGNOSIS — S72002D Fracture of unspecified part of neck of left femur, subsequent encounter for closed fracture with routine healing: Secondary | ICD-10-CM | POA: Diagnosis not present

## 2022-01-04 DIAGNOSIS — K5900 Constipation, unspecified: Secondary | ICD-10-CM | POA: Diagnosis not present

## 2022-01-04 DIAGNOSIS — I10 Essential (primary) hypertension: Secondary | ICD-10-CM | POA: Diagnosis not present

## 2022-01-04 DIAGNOSIS — E785 Hyperlipidemia, unspecified: Secondary | ICD-10-CM | POA: Diagnosis not present

## 2022-01-04 DIAGNOSIS — E039 Hypothyroidism, unspecified: Secondary | ICD-10-CM | POA: Diagnosis not present

## 2022-01-04 DIAGNOSIS — E119 Type 2 diabetes mellitus without complications: Secondary | ICD-10-CM | POA: Diagnosis not present

## 2022-01-04 DIAGNOSIS — Z96642 Presence of left artificial hip joint: Secondary | ICD-10-CM | POA: Diagnosis not present

## 2022-01-04 DIAGNOSIS — Z9181 History of falling: Secondary | ICD-10-CM | POA: Diagnosis not present

## 2022-01-04 DIAGNOSIS — D649 Anemia, unspecified: Secondary | ICD-10-CM | POA: Diagnosis not present

## 2022-01-05 DIAGNOSIS — I10 Essential (primary) hypertension: Secondary | ICD-10-CM | POA: Diagnosis not present

## 2022-01-05 DIAGNOSIS — E1122 Type 2 diabetes mellitus with diabetic chronic kidney disease: Secondary | ICD-10-CM | POA: Diagnosis not present

## 2022-01-05 DIAGNOSIS — E039 Hypothyroidism, unspecified: Secondary | ICD-10-CM | POA: Diagnosis not present

## 2022-01-05 DIAGNOSIS — G301 Alzheimer's disease with late onset: Secondary | ICD-10-CM | POA: Diagnosis not present

## 2022-01-05 DIAGNOSIS — E782 Mixed hyperlipidemia: Secondary | ICD-10-CM | POA: Diagnosis not present

## 2022-01-09 DIAGNOSIS — I10 Essential (primary) hypertension: Secondary | ICD-10-CM | POA: Diagnosis not present

## 2022-01-09 DIAGNOSIS — E785 Hyperlipidemia, unspecified: Secondary | ICD-10-CM | POA: Diagnosis not present

## 2022-01-09 DIAGNOSIS — E119 Type 2 diabetes mellitus without complications: Secondary | ICD-10-CM | POA: Diagnosis not present

## 2022-01-09 DIAGNOSIS — Z7982 Long term (current) use of aspirin: Secondary | ICD-10-CM | POA: Diagnosis not present

## 2022-01-09 DIAGNOSIS — Z9181 History of falling: Secondary | ICD-10-CM | POA: Diagnosis not present

## 2022-01-09 DIAGNOSIS — G301 Alzheimer's disease with late onset: Secondary | ICD-10-CM | POA: Diagnosis not present

## 2022-01-09 DIAGNOSIS — E1122 Type 2 diabetes mellitus with diabetic chronic kidney disease: Secondary | ICD-10-CM | POA: Diagnosis not present

## 2022-01-09 DIAGNOSIS — Z7984 Long term (current) use of oral hypoglycemic drugs: Secondary | ICD-10-CM | POA: Diagnosis not present

## 2022-01-09 DIAGNOSIS — K5900 Constipation, unspecified: Secondary | ICD-10-CM | POA: Diagnosis not present

## 2022-01-09 DIAGNOSIS — S72002D Fracture of unspecified part of neck of left femur, subsequent encounter for closed fracture with routine healing: Secondary | ICD-10-CM | POA: Diagnosis not present

## 2022-01-09 DIAGNOSIS — E039 Hypothyroidism, unspecified: Secondary | ICD-10-CM | POA: Diagnosis not present

## 2022-01-09 DIAGNOSIS — D649 Anemia, unspecified: Secondary | ICD-10-CM | POA: Diagnosis not present

## 2022-01-09 DIAGNOSIS — Z96642 Presence of left artificial hip joint: Secondary | ICD-10-CM | POA: Diagnosis not present

## 2022-01-18 DIAGNOSIS — Z9181 History of falling: Secondary | ICD-10-CM | POA: Diagnosis not present

## 2022-01-18 DIAGNOSIS — D649 Anemia, unspecified: Secondary | ICD-10-CM | POA: Diagnosis not present

## 2022-01-18 DIAGNOSIS — Z7982 Long term (current) use of aspirin: Secondary | ICD-10-CM | POA: Diagnosis not present

## 2022-01-18 DIAGNOSIS — S72002D Fracture of unspecified part of neck of left femur, subsequent encounter for closed fracture with routine healing: Secondary | ICD-10-CM | POA: Diagnosis not present

## 2022-01-18 DIAGNOSIS — Z96642 Presence of left artificial hip joint: Secondary | ICD-10-CM | POA: Diagnosis not present

## 2022-01-18 DIAGNOSIS — I10 Essential (primary) hypertension: Secondary | ICD-10-CM | POA: Diagnosis not present

## 2022-01-18 DIAGNOSIS — K5900 Constipation, unspecified: Secondary | ICD-10-CM | POA: Diagnosis not present

## 2022-01-18 DIAGNOSIS — E039 Hypothyroidism, unspecified: Secondary | ICD-10-CM | POA: Diagnosis not present

## 2022-01-18 DIAGNOSIS — E785 Hyperlipidemia, unspecified: Secondary | ICD-10-CM | POA: Diagnosis not present

## 2022-01-18 DIAGNOSIS — E119 Type 2 diabetes mellitus without complications: Secondary | ICD-10-CM | POA: Diagnosis not present

## 2022-01-18 DIAGNOSIS — Z7984 Long term (current) use of oral hypoglycemic drugs: Secondary | ICD-10-CM | POA: Diagnosis not present

## 2022-01-19 DIAGNOSIS — E1122 Type 2 diabetes mellitus with diabetic chronic kidney disease: Secondary | ICD-10-CM | POA: Diagnosis not present

## 2022-01-19 DIAGNOSIS — E782 Mixed hyperlipidemia: Secondary | ICD-10-CM | POA: Diagnosis not present

## 2022-01-19 DIAGNOSIS — I10 Essential (primary) hypertension: Secondary | ICD-10-CM | POA: Diagnosis not present

## 2022-01-19 DIAGNOSIS — E039 Hypothyroidism, unspecified: Secondary | ICD-10-CM | POA: Diagnosis not present

## 2022-01-19 DIAGNOSIS — G301 Alzheimer's disease with late onset: Secondary | ICD-10-CM | POA: Diagnosis not present

## 2022-01-24 DIAGNOSIS — E039 Hypothyroidism, unspecified: Secondary | ICD-10-CM | POA: Diagnosis not present

## 2022-01-24 DIAGNOSIS — Z7982 Long term (current) use of aspirin: Secondary | ICD-10-CM | POA: Diagnosis not present

## 2022-01-24 DIAGNOSIS — I10 Essential (primary) hypertension: Secondary | ICD-10-CM | POA: Diagnosis not present

## 2022-01-24 DIAGNOSIS — S72002D Fracture of unspecified part of neck of left femur, subsequent encounter for closed fracture with routine healing: Secondary | ICD-10-CM | POA: Diagnosis not present

## 2022-01-24 DIAGNOSIS — E785 Hyperlipidemia, unspecified: Secondary | ICD-10-CM | POA: Diagnosis not present

## 2022-01-24 DIAGNOSIS — Z96642 Presence of left artificial hip joint: Secondary | ICD-10-CM | POA: Diagnosis not present

## 2022-01-24 DIAGNOSIS — E119 Type 2 diabetes mellitus without complications: Secondary | ICD-10-CM | POA: Diagnosis not present

## 2022-01-24 DIAGNOSIS — Z9181 History of falling: Secondary | ICD-10-CM | POA: Diagnosis not present

## 2022-01-24 DIAGNOSIS — D649 Anemia, unspecified: Secondary | ICD-10-CM | POA: Diagnosis not present

## 2022-01-24 DIAGNOSIS — K5900 Constipation, unspecified: Secondary | ICD-10-CM | POA: Diagnosis not present

## 2022-01-24 DIAGNOSIS — Z7984 Long term (current) use of oral hypoglycemic drugs: Secondary | ICD-10-CM | POA: Diagnosis not present

## 2022-01-29 DIAGNOSIS — Z9181 History of falling: Secondary | ICD-10-CM | POA: Diagnosis not present

## 2022-01-29 DIAGNOSIS — E039 Hypothyroidism, unspecified: Secondary | ICD-10-CM | POA: Diagnosis not present

## 2022-01-29 DIAGNOSIS — E119 Type 2 diabetes mellitus without complications: Secondary | ICD-10-CM | POA: Diagnosis not present

## 2022-01-29 DIAGNOSIS — S72002D Fracture of unspecified part of neck of left femur, subsequent encounter for closed fracture with routine healing: Secondary | ICD-10-CM | POA: Diagnosis not present

## 2022-01-29 DIAGNOSIS — Z96642 Presence of left artificial hip joint: Secondary | ICD-10-CM | POA: Diagnosis not present

## 2022-01-29 DIAGNOSIS — D649 Anemia, unspecified: Secondary | ICD-10-CM | POA: Diagnosis not present

## 2022-01-29 DIAGNOSIS — Z7984 Long term (current) use of oral hypoglycemic drugs: Secondary | ICD-10-CM | POA: Diagnosis not present

## 2022-01-29 DIAGNOSIS — Z7982 Long term (current) use of aspirin: Secondary | ICD-10-CM | POA: Diagnosis not present

## 2022-01-29 DIAGNOSIS — I10 Essential (primary) hypertension: Secondary | ICD-10-CM | POA: Diagnosis not present

## 2022-01-29 DIAGNOSIS — E785 Hyperlipidemia, unspecified: Secondary | ICD-10-CM | POA: Diagnosis not present

## 2022-01-29 DIAGNOSIS — K5900 Constipation, unspecified: Secondary | ICD-10-CM | POA: Diagnosis not present

## 2022-02-09 DIAGNOSIS — I10 Essential (primary) hypertension: Secondary | ICD-10-CM | POA: Diagnosis not present

## 2022-02-09 DIAGNOSIS — Z96642 Presence of left artificial hip joint: Secondary | ICD-10-CM | POA: Diagnosis not present

## 2022-02-09 DIAGNOSIS — D649 Anemia, unspecified: Secondary | ICD-10-CM | POA: Diagnosis not present

## 2022-02-09 DIAGNOSIS — Z7984 Long term (current) use of oral hypoglycemic drugs: Secondary | ICD-10-CM | POA: Diagnosis not present

## 2022-02-09 DIAGNOSIS — K5900 Constipation, unspecified: Secondary | ICD-10-CM | POA: Diagnosis not present

## 2022-02-09 DIAGNOSIS — Z9181 History of falling: Secondary | ICD-10-CM | POA: Diagnosis not present

## 2022-02-09 DIAGNOSIS — E785 Hyperlipidemia, unspecified: Secondary | ICD-10-CM | POA: Diagnosis not present

## 2022-02-09 DIAGNOSIS — S72002D Fracture of unspecified part of neck of left femur, subsequent encounter for closed fracture with routine healing: Secondary | ICD-10-CM | POA: Diagnosis not present

## 2022-02-09 DIAGNOSIS — E039 Hypothyroidism, unspecified: Secondary | ICD-10-CM | POA: Diagnosis not present

## 2022-02-09 DIAGNOSIS — E119 Type 2 diabetes mellitus without complications: Secondary | ICD-10-CM | POA: Diagnosis not present

## 2022-02-09 DIAGNOSIS — Z7982 Long term (current) use of aspirin: Secondary | ICD-10-CM | POA: Diagnosis not present

## 2022-02-12 DIAGNOSIS — Z7984 Long term (current) use of oral hypoglycemic drugs: Secondary | ICD-10-CM | POA: Diagnosis not present

## 2022-02-12 DIAGNOSIS — Z96642 Presence of left artificial hip joint: Secondary | ICD-10-CM | POA: Diagnosis not present

## 2022-02-12 DIAGNOSIS — Z9181 History of falling: Secondary | ICD-10-CM | POA: Diagnosis not present

## 2022-02-12 DIAGNOSIS — Z7982 Long term (current) use of aspirin: Secondary | ICD-10-CM | POA: Diagnosis not present

## 2022-02-12 DIAGNOSIS — I10 Essential (primary) hypertension: Secondary | ICD-10-CM | POA: Diagnosis not present

## 2022-02-12 DIAGNOSIS — K5900 Constipation, unspecified: Secondary | ICD-10-CM | POA: Diagnosis not present

## 2022-02-12 DIAGNOSIS — E785 Hyperlipidemia, unspecified: Secondary | ICD-10-CM | POA: Diagnosis not present

## 2022-02-12 DIAGNOSIS — S72002D Fracture of unspecified part of neck of left femur, subsequent encounter for closed fracture with routine healing: Secondary | ICD-10-CM | POA: Diagnosis not present

## 2022-02-12 DIAGNOSIS — E119 Type 2 diabetes mellitus without complications: Secondary | ICD-10-CM | POA: Diagnosis not present

## 2022-02-12 DIAGNOSIS — D649 Anemia, unspecified: Secondary | ICD-10-CM | POA: Diagnosis not present

## 2022-02-12 DIAGNOSIS — E039 Hypothyroidism, unspecified: Secondary | ICD-10-CM | POA: Diagnosis not present

## 2022-02-20 DIAGNOSIS — I10 Essential (primary) hypertension: Secondary | ICD-10-CM | POA: Diagnosis not present

## 2022-02-20 DIAGNOSIS — E782 Mixed hyperlipidemia: Secondary | ICD-10-CM | POA: Diagnosis not present

## 2022-02-20 DIAGNOSIS — E1122 Type 2 diabetes mellitus with diabetic chronic kidney disease: Secondary | ICD-10-CM | POA: Diagnosis not present

## 2022-02-20 DIAGNOSIS — E039 Hypothyroidism, unspecified: Secondary | ICD-10-CM | POA: Diagnosis not present

## 2022-02-20 DIAGNOSIS — E559 Vitamin D deficiency, unspecified: Secondary | ICD-10-CM | POA: Diagnosis not present

## 2022-02-21 DIAGNOSIS — Z96642 Presence of left artificial hip joint: Secondary | ICD-10-CM | POA: Diagnosis not present

## 2022-02-21 DIAGNOSIS — S72002D Fracture of unspecified part of neck of left femur, subsequent encounter for closed fracture with routine healing: Secondary | ICD-10-CM | POA: Diagnosis not present

## 2022-02-21 DIAGNOSIS — D649 Anemia, unspecified: Secondary | ICD-10-CM | POA: Diagnosis not present

## 2022-02-21 DIAGNOSIS — I10 Essential (primary) hypertension: Secondary | ICD-10-CM | POA: Diagnosis not present

## 2022-02-21 DIAGNOSIS — Z9181 History of falling: Secondary | ICD-10-CM | POA: Diagnosis not present

## 2022-02-21 DIAGNOSIS — Z7982 Long term (current) use of aspirin: Secondary | ICD-10-CM | POA: Diagnosis not present

## 2022-02-21 DIAGNOSIS — E039 Hypothyroidism, unspecified: Secondary | ICD-10-CM | POA: Diagnosis not present

## 2022-02-21 DIAGNOSIS — K5900 Constipation, unspecified: Secondary | ICD-10-CM | POA: Diagnosis not present

## 2022-02-21 DIAGNOSIS — E785 Hyperlipidemia, unspecified: Secondary | ICD-10-CM | POA: Diagnosis not present

## 2022-02-21 DIAGNOSIS — Z7984 Long term (current) use of oral hypoglycemic drugs: Secondary | ICD-10-CM | POA: Diagnosis not present

## 2022-02-21 DIAGNOSIS — E119 Type 2 diabetes mellitus without complications: Secondary | ICD-10-CM | POA: Diagnosis not present

## 2022-02-23 DIAGNOSIS — L0390 Cellulitis, unspecified: Secondary | ICD-10-CM | POA: Diagnosis not present

## 2022-02-23 DIAGNOSIS — H00011 Hordeolum externum right upper eyelid: Secondary | ICD-10-CM | POA: Diagnosis not present

## 2022-02-27 DIAGNOSIS — D649 Anemia, unspecified: Secondary | ICD-10-CM | POA: Diagnosis not present

## 2022-02-27 DIAGNOSIS — E039 Hypothyroidism, unspecified: Secondary | ICD-10-CM | POA: Diagnosis not present

## 2022-02-27 DIAGNOSIS — S72002D Fracture of unspecified part of neck of left femur, subsequent encounter for closed fracture with routine healing: Secondary | ICD-10-CM | POA: Diagnosis not present

## 2022-02-27 DIAGNOSIS — Z96642 Presence of left artificial hip joint: Secondary | ICD-10-CM | POA: Diagnosis not present

## 2022-02-27 DIAGNOSIS — E785 Hyperlipidemia, unspecified: Secondary | ICD-10-CM | POA: Diagnosis not present

## 2022-02-27 DIAGNOSIS — I10 Essential (primary) hypertension: Secondary | ICD-10-CM | POA: Diagnosis not present

## 2022-02-27 DIAGNOSIS — Z7982 Long term (current) use of aspirin: Secondary | ICD-10-CM | POA: Diagnosis not present

## 2022-02-27 DIAGNOSIS — K5900 Constipation, unspecified: Secondary | ICD-10-CM | POA: Diagnosis not present

## 2022-02-27 DIAGNOSIS — Z9181 History of falling: Secondary | ICD-10-CM | POA: Diagnosis not present

## 2022-02-27 DIAGNOSIS — Z7984 Long term (current) use of oral hypoglycemic drugs: Secondary | ICD-10-CM | POA: Diagnosis not present

## 2022-02-27 DIAGNOSIS — E119 Type 2 diabetes mellitus without complications: Secondary | ICD-10-CM | POA: Diagnosis not present

## 2022-03-04 DIAGNOSIS — R001 Bradycardia, unspecified: Secondary | ICD-10-CM | POA: Diagnosis not present

## 2022-03-04 DIAGNOSIS — Z20822 Contact with and (suspected) exposure to covid-19: Secondary | ICD-10-CM | POA: Diagnosis not present

## 2022-03-04 DIAGNOSIS — R531 Weakness: Secondary | ICD-10-CM | POA: Diagnosis not present

## 2022-03-04 DIAGNOSIS — E119 Type 2 diabetes mellitus without complications: Secondary | ICD-10-CM | POA: Diagnosis not present

## 2022-03-04 DIAGNOSIS — Z743 Need for continuous supervision: Secondary | ICD-10-CM | POA: Diagnosis not present

## 2022-03-04 DIAGNOSIS — G309 Alzheimer's disease, unspecified: Secondary | ICD-10-CM | POA: Diagnosis not present

## 2022-03-04 DIAGNOSIS — I499 Cardiac arrhythmia, unspecified: Secondary | ICD-10-CM | POA: Diagnosis not present

## 2022-03-09 DIAGNOSIS — G301 Alzheimer's disease with late onset: Secondary | ICD-10-CM | POA: Diagnosis not present

## 2022-03-09 DIAGNOSIS — I129 Hypertensive chronic kidney disease with stage 1 through stage 4 chronic kidney disease, or unspecified chronic kidney disease: Secondary | ICD-10-CM | POA: Diagnosis not present

## 2022-03-13 DIAGNOSIS — E782 Mixed hyperlipidemia: Secondary | ICD-10-CM | POA: Diagnosis not present

## 2022-03-13 DIAGNOSIS — R1310 Dysphagia, unspecified: Secondary | ICD-10-CM | POA: Diagnosis not present

## 2022-03-13 DIAGNOSIS — E1122 Type 2 diabetes mellitus with diabetic chronic kidney disease: Secondary | ICD-10-CM | POA: Diagnosis not present

## 2022-03-13 DIAGNOSIS — E559 Vitamin D deficiency, unspecified: Secondary | ICD-10-CM | POA: Diagnosis not present

## 2022-03-13 DIAGNOSIS — M6281 Muscle weakness (generalized): Secondary | ICD-10-CM | POA: Diagnosis not present

## 2022-03-13 DIAGNOSIS — E039 Hypothyroidism, unspecified: Secondary | ICD-10-CM | POA: Diagnosis not present

## 2022-03-13 DIAGNOSIS — I10 Essential (primary) hypertension: Secondary | ICD-10-CM | POA: Diagnosis not present

## 2022-03-23 DIAGNOSIS — E538 Deficiency of other specified B group vitamins: Secondary | ICD-10-CM | POA: Diagnosis not present

## 2022-03-23 DIAGNOSIS — F02C3 Dementia in other diseases classified elsewhere, severe, with mood disturbance: Secondary | ICD-10-CM | POA: Diagnosis not present

## 2022-03-23 DIAGNOSIS — Z7984 Long term (current) use of oral hypoglycemic drugs: Secondary | ICD-10-CM | POA: Diagnosis not present

## 2022-03-23 DIAGNOSIS — Z87891 Personal history of nicotine dependence: Secondary | ICD-10-CM | POA: Diagnosis not present

## 2022-03-23 DIAGNOSIS — F02C11 Dementia in other diseases classified elsewhere, severe, with agitation: Secondary | ICD-10-CM | POA: Diagnosis not present

## 2022-03-23 DIAGNOSIS — G301 Alzheimer's disease with late onset: Secondary | ICD-10-CM | POA: Diagnosis not present

## 2022-03-23 DIAGNOSIS — I10 Essential (primary) hypertension: Secondary | ICD-10-CM | POA: Diagnosis not present

## 2022-03-23 DIAGNOSIS — F01C11 Vascular dementia, severe, with agitation: Secondary | ICD-10-CM | POA: Diagnosis not present

## 2022-03-23 DIAGNOSIS — F01C3 Vascular dementia, severe, with mood disturbance: Secondary | ICD-10-CM | POA: Diagnosis not present

## 2022-03-23 DIAGNOSIS — Z9181 History of falling: Secondary | ICD-10-CM | POA: Diagnosis not present

## 2022-03-23 DIAGNOSIS — E119 Type 2 diabetes mellitus without complications: Secondary | ICD-10-CM | POA: Diagnosis not present

## 2022-03-23 DIAGNOSIS — E7800 Pure hypercholesterolemia, unspecified: Secondary | ICD-10-CM | POA: Diagnosis not present

## 2022-03-23 DIAGNOSIS — F01C18 Vascular dementia, severe, with other behavioral disturbance: Secondary | ICD-10-CM | POA: Diagnosis not present

## 2022-03-23 DIAGNOSIS — E039 Hypothyroidism, unspecified: Secondary | ICD-10-CM | POA: Diagnosis not present

## 2022-03-23 DIAGNOSIS — F02C18 Dementia in other diseases classified elsewhere, severe, with other behavioral disturbance: Secondary | ICD-10-CM | POA: Diagnosis not present

## 2022-03-23 DIAGNOSIS — R1310 Dysphagia, unspecified: Secondary | ICD-10-CM | POA: Diagnosis not present

## 2022-03-23 DIAGNOSIS — E559 Vitamin D deficiency, unspecified: Secondary | ICD-10-CM | POA: Diagnosis not present

## 2022-03-27 DIAGNOSIS — E1122 Type 2 diabetes mellitus with diabetic chronic kidney disease: Secondary | ICD-10-CM | POA: Diagnosis not present

## 2022-03-27 DIAGNOSIS — E559 Vitamin D deficiency, unspecified: Secondary | ICD-10-CM | POA: Diagnosis not present

## 2022-03-27 DIAGNOSIS — E782 Mixed hyperlipidemia: Secondary | ICD-10-CM | POA: Diagnosis not present

## 2022-03-27 DIAGNOSIS — M6281 Muscle weakness (generalized): Secondary | ICD-10-CM | POA: Diagnosis not present

## 2022-03-27 DIAGNOSIS — R1310 Dysphagia, unspecified: Secondary | ICD-10-CM | POA: Diagnosis not present

## 2022-03-27 DIAGNOSIS — E038 Other specified hypothyroidism: Secondary | ICD-10-CM | POA: Diagnosis not present

## 2022-03-27 DIAGNOSIS — I10 Essential (primary) hypertension: Secondary | ICD-10-CM | POA: Diagnosis not present

## 2022-03-28 DIAGNOSIS — F02C18 Dementia in other diseases classified elsewhere, severe, with other behavioral disturbance: Secondary | ICD-10-CM | POA: Diagnosis not present

## 2022-03-28 DIAGNOSIS — Z87891 Personal history of nicotine dependence: Secondary | ICD-10-CM | POA: Diagnosis not present

## 2022-03-28 DIAGNOSIS — I10 Essential (primary) hypertension: Secondary | ICD-10-CM | POA: Diagnosis not present

## 2022-03-28 DIAGNOSIS — F01C3 Vascular dementia, severe, with mood disturbance: Secondary | ICD-10-CM | POA: Diagnosis not present

## 2022-03-28 DIAGNOSIS — I129 Hypertensive chronic kidney disease with stage 1 through stage 4 chronic kidney disease, or unspecified chronic kidney disease: Secondary | ICD-10-CM | POA: Diagnosis not present

## 2022-03-28 DIAGNOSIS — G301 Alzheimer's disease with late onset: Secondary | ICD-10-CM | POA: Diagnosis not present

## 2022-03-28 DIAGNOSIS — F01C11 Vascular dementia, severe, with agitation: Secondary | ICD-10-CM | POA: Diagnosis not present

## 2022-03-28 DIAGNOSIS — E038 Other specified hypothyroidism: Secondary | ICD-10-CM | POA: Diagnosis not present

## 2022-03-28 DIAGNOSIS — R1310 Dysphagia, unspecified: Secondary | ICD-10-CM | POA: Diagnosis not present

## 2022-03-28 DIAGNOSIS — E7800 Pure hypercholesterolemia, unspecified: Secondary | ICD-10-CM | POA: Diagnosis not present

## 2022-03-28 DIAGNOSIS — E119 Type 2 diabetes mellitus without complications: Secondary | ICD-10-CM | POA: Diagnosis not present

## 2022-03-28 DIAGNOSIS — E782 Mixed hyperlipidemia: Secondary | ICD-10-CM | POA: Diagnosis not present

## 2022-03-28 DIAGNOSIS — E039 Hypothyroidism, unspecified: Secondary | ICD-10-CM | POA: Diagnosis not present

## 2022-03-28 DIAGNOSIS — Z7984 Long term (current) use of oral hypoglycemic drugs: Secondary | ICD-10-CM | POA: Diagnosis not present

## 2022-03-28 DIAGNOSIS — F02C11 Dementia in other diseases classified elsewhere, severe, with agitation: Secondary | ICD-10-CM | POA: Diagnosis not present

## 2022-03-28 DIAGNOSIS — Z9181 History of falling: Secondary | ICD-10-CM | POA: Diagnosis not present

## 2022-03-28 DIAGNOSIS — E1122 Type 2 diabetes mellitus with diabetic chronic kidney disease: Secondary | ICD-10-CM | POA: Diagnosis not present

## 2022-03-28 DIAGNOSIS — E559 Vitamin D deficiency, unspecified: Secondary | ICD-10-CM | POA: Diagnosis not present

## 2022-03-28 DIAGNOSIS — F01C18 Vascular dementia, severe, with other behavioral disturbance: Secondary | ICD-10-CM | POA: Diagnosis not present

## 2022-03-28 DIAGNOSIS — F02C3 Dementia in other diseases classified elsewhere, severe, with mood disturbance: Secondary | ICD-10-CM | POA: Diagnosis not present

## 2022-03-28 DIAGNOSIS — N1831 Chronic kidney disease, stage 3a: Secondary | ICD-10-CM | POA: Diagnosis not present

## 2022-03-28 DIAGNOSIS — E538 Deficiency of other specified B group vitamins: Secondary | ICD-10-CM | POA: Diagnosis not present

## 2022-03-28 DIAGNOSIS — F02818 Dementia in other diseases classified elsewhere, unspecified severity, with other behavioral disturbance: Secondary | ICD-10-CM | POA: Diagnosis not present

## 2022-03-30 DIAGNOSIS — E1100 Type 2 diabetes mellitus with hyperosmolarity without nonketotic hyperglycemic-hyperosmolar coma (NKHHC): Secondary | ICD-10-CM | POA: Diagnosis not present

## 2022-03-30 DIAGNOSIS — E039 Hypothyroidism, unspecified: Secondary | ICD-10-CM | POA: Diagnosis not present

## 2022-03-30 DIAGNOSIS — I10 Essential (primary) hypertension: Secondary | ICD-10-CM | POA: Diagnosis not present

## 2022-04-03 DIAGNOSIS — E039 Hypothyroidism, unspecified: Secondary | ICD-10-CM | POA: Diagnosis not present

## 2022-04-03 DIAGNOSIS — F01C11 Vascular dementia, severe, with agitation: Secondary | ICD-10-CM | POA: Diagnosis not present

## 2022-04-03 DIAGNOSIS — F01C3 Vascular dementia, severe, with mood disturbance: Secondary | ICD-10-CM | POA: Diagnosis not present

## 2022-04-03 DIAGNOSIS — F02C11 Dementia in other diseases classified elsewhere, severe, with agitation: Secondary | ICD-10-CM | POA: Diagnosis not present

## 2022-04-03 DIAGNOSIS — R1310 Dysphagia, unspecified: Secondary | ICD-10-CM | POA: Diagnosis not present

## 2022-04-03 DIAGNOSIS — E7800 Pure hypercholesterolemia, unspecified: Secondary | ICD-10-CM | POA: Diagnosis not present

## 2022-04-03 DIAGNOSIS — F01C18 Vascular dementia, severe, with other behavioral disturbance: Secondary | ICD-10-CM | POA: Diagnosis not present

## 2022-04-03 DIAGNOSIS — G301 Alzheimer's disease with late onset: Secondary | ICD-10-CM | POA: Diagnosis not present

## 2022-04-03 DIAGNOSIS — E538 Deficiency of other specified B group vitamins: Secondary | ICD-10-CM | POA: Diagnosis not present

## 2022-04-03 DIAGNOSIS — E119 Type 2 diabetes mellitus without complications: Secondary | ICD-10-CM | POA: Diagnosis not present

## 2022-04-03 DIAGNOSIS — E559 Vitamin D deficiency, unspecified: Secondary | ICD-10-CM | POA: Diagnosis not present

## 2022-04-03 DIAGNOSIS — Z87891 Personal history of nicotine dependence: Secondary | ICD-10-CM | POA: Diagnosis not present

## 2022-04-03 DIAGNOSIS — I10 Essential (primary) hypertension: Secondary | ICD-10-CM | POA: Diagnosis not present

## 2022-04-03 DIAGNOSIS — Z9181 History of falling: Secondary | ICD-10-CM | POA: Diagnosis not present

## 2022-04-03 DIAGNOSIS — Z7984 Long term (current) use of oral hypoglycemic drugs: Secondary | ICD-10-CM | POA: Diagnosis not present

## 2022-04-03 DIAGNOSIS — F02C3 Dementia in other diseases classified elsewhere, severe, with mood disturbance: Secondary | ICD-10-CM | POA: Diagnosis not present

## 2022-04-03 DIAGNOSIS — F02C18 Dementia in other diseases classified elsewhere, severe, with other behavioral disturbance: Secondary | ICD-10-CM | POA: Diagnosis not present

## 2022-04-05 DIAGNOSIS — I129 Hypertensive chronic kidney disease with stage 1 through stage 4 chronic kidney disease, or unspecified chronic kidney disease: Secondary | ICD-10-CM | POA: Diagnosis not present

## 2022-04-05 DIAGNOSIS — L98411 Non-pressure chronic ulcer of buttock limited to breakdown of skin: Secondary | ICD-10-CM | POA: Diagnosis not present

## 2022-04-09 DIAGNOSIS — E538 Deficiency of other specified B group vitamins: Secondary | ICD-10-CM | POA: Diagnosis not present

## 2022-04-09 DIAGNOSIS — I10 Essential (primary) hypertension: Secondary | ICD-10-CM | POA: Diagnosis not present

## 2022-04-09 DIAGNOSIS — E7800 Pure hypercholesterolemia, unspecified: Secondary | ICD-10-CM | POA: Diagnosis not present

## 2022-04-09 DIAGNOSIS — F01C11 Vascular dementia, severe, with agitation: Secondary | ICD-10-CM | POA: Diagnosis not present

## 2022-04-09 DIAGNOSIS — Z87891 Personal history of nicotine dependence: Secondary | ICD-10-CM | POA: Diagnosis not present

## 2022-04-09 DIAGNOSIS — F01C18 Vascular dementia, severe, with other behavioral disturbance: Secondary | ICD-10-CM | POA: Diagnosis not present

## 2022-04-09 DIAGNOSIS — Z7984 Long term (current) use of oral hypoglycemic drugs: Secondary | ICD-10-CM | POA: Diagnosis not present

## 2022-04-09 DIAGNOSIS — R1310 Dysphagia, unspecified: Secondary | ICD-10-CM | POA: Diagnosis not present

## 2022-04-09 DIAGNOSIS — F02C18 Dementia in other diseases classified elsewhere, severe, with other behavioral disturbance: Secondary | ICD-10-CM | POA: Diagnosis not present

## 2022-04-09 DIAGNOSIS — E559 Vitamin D deficiency, unspecified: Secondary | ICD-10-CM | POA: Diagnosis not present

## 2022-04-09 DIAGNOSIS — F01C3 Vascular dementia, severe, with mood disturbance: Secondary | ICD-10-CM | POA: Diagnosis not present

## 2022-04-09 DIAGNOSIS — G301 Alzheimer's disease with late onset: Secondary | ICD-10-CM | POA: Diagnosis not present

## 2022-04-09 DIAGNOSIS — F02C3 Dementia in other diseases classified elsewhere, severe, with mood disturbance: Secondary | ICD-10-CM | POA: Diagnosis not present

## 2022-04-09 DIAGNOSIS — F02C11 Dementia in other diseases classified elsewhere, severe, with agitation: Secondary | ICD-10-CM | POA: Diagnosis not present

## 2022-04-09 DIAGNOSIS — Z9181 History of falling: Secondary | ICD-10-CM | POA: Diagnosis not present

## 2022-04-09 DIAGNOSIS — E119 Type 2 diabetes mellitus without complications: Secondary | ICD-10-CM | POA: Diagnosis not present

## 2022-04-09 DIAGNOSIS — E039 Hypothyroidism, unspecified: Secondary | ICD-10-CM | POA: Diagnosis not present

## 2022-04-12 DIAGNOSIS — R1310 Dysphagia, unspecified: Secondary | ICD-10-CM | POA: Diagnosis not present

## 2022-04-12 DIAGNOSIS — E039 Hypothyroidism, unspecified: Secondary | ICD-10-CM | POA: Diagnosis not present

## 2022-04-12 DIAGNOSIS — I10 Essential (primary) hypertension: Secondary | ICD-10-CM | POA: Diagnosis not present

## 2022-04-17 DIAGNOSIS — L98411 Non-pressure chronic ulcer of buttock limited to breakdown of skin: Secondary | ICD-10-CM | POA: Diagnosis not present

## 2022-04-17 DIAGNOSIS — G301 Alzheimer's disease with late onset: Secondary | ICD-10-CM | POA: Diagnosis not present

## 2022-04-17 DIAGNOSIS — R233 Spontaneous ecchymoses: Secondary | ICD-10-CM | POA: Diagnosis not present

## 2022-04-20 DIAGNOSIS — I10 Essential (primary) hypertension: Secondary | ICD-10-CM | POA: Diagnosis not present

## 2022-04-20 DIAGNOSIS — M6281 Muscle weakness (generalized): Secondary | ICD-10-CM | POA: Diagnosis not present

## 2022-04-20 DIAGNOSIS — E038 Other specified hypothyroidism: Secondary | ICD-10-CM | POA: Diagnosis not present

## 2022-04-20 DIAGNOSIS — E1122 Type 2 diabetes mellitus with diabetic chronic kidney disease: Secondary | ICD-10-CM | POA: Diagnosis not present

## 2022-04-20 DIAGNOSIS — E782 Mixed hyperlipidemia: Secondary | ICD-10-CM | POA: Diagnosis not present

## 2022-04-20 DIAGNOSIS — E559 Vitamin D deficiency, unspecified: Secondary | ICD-10-CM | POA: Diagnosis not present

## 2022-05-02 DIAGNOSIS — Z9181 History of falling: Secondary | ICD-10-CM | POA: Diagnosis not present

## 2022-05-02 DIAGNOSIS — I10 Essential (primary) hypertension: Secondary | ICD-10-CM | POA: Diagnosis not present

## 2022-05-02 DIAGNOSIS — E039 Hypothyroidism, unspecified: Secondary | ICD-10-CM | POA: Diagnosis not present

## 2022-05-02 DIAGNOSIS — G301 Alzheimer's disease with late onset: Secondary | ICD-10-CM | POA: Diagnosis not present

## 2022-05-02 DIAGNOSIS — E538 Deficiency of other specified B group vitamins: Secondary | ICD-10-CM | POA: Diagnosis not present

## 2022-05-02 DIAGNOSIS — F02C11 Dementia in other diseases classified elsewhere, severe, with agitation: Secondary | ICD-10-CM | POA: Diagnosis not present

## 2022-05-02 DIAGNOSIS — E119 Type 2 diabetes mellitus without complications: Secondary | ICD-10-CM | POA: Diagnosis not present

## 2022-05-02 DIAGNOSIS — F02C3 Dementia in other diseases classified elsewhere, severe, with mood disturbance: Secondary | ICD-10-CM | POA: Diagnosis not present

## 2022-05-02 DIAGNOSIS — Z7984 Long term (current) use of oral hypoglycemic drugs: Secondary | ICD-10-CM | POA: Diagnosis not present

## 2022-05-02 DIAGNOSIS — F01C18 Vascular dementia, severe, with other behavioral disturbance: Secondary | ICD-10-CM | POA: Diagnosis not present

## 2022-05-02 DIAGNOSIS — F02C18 Dementia in other diseases classified elsewhere, severe, with other behavioral disturbance: Secondary | ICD-10-CM | POA: Diagnosis not present

## 2022-05-02 DIAGNOSIS — E7800 Pure hypercholesterolemia, unspecified: Secondary | ICD-10-CM | POA: Diagnosis not present

## 2022-05-02 DIAGNOSIS — E559 Vitamin D deficiency, unspecified: Secondary | ICD-10-CM | POA: Diagnosis not present

## 2022-05-02 DIAGNOSIS — F01C3 Vascular dementia, severe, with mood disturbance: Secondary | ICD-10-CM | POA: Diagnosis not present

## 2022-05-02 DIAGNOSIS — R1310 Dysphagia, unspecified: Secondary | ICD-10-CM | POA: Diagnosis not present

## 2022-05-02 DIAGNOSIS — Z87891 Personal history of nicotine dependence: Secondary | ICD-10-CM | POA: Diagnosis not present

## 2022-05-02 DIAGNOSIS — F01C11 Vascular dementia, severe, with agitation: Secondary | ICD-10-CM | POA: Diagnosis not present

## 2022-05-08 DIAGNOSIS — I129 Hypertensive chronic kidney disease with stage 1 through stage 4 chronic kidney disease, or unspecified chronic kidney disease: Secondary | ICD-10-CM | POA: Diagnosis not present

## 2022-05-08 DIAGNOSIS — L98411 Non-pressure chronic ulcer of buttock limited to breakdown of skin: Secondary | ICD-10-CM | POA: Diagnosis not present

## 2022-05-09 DIAGNOSIS — N1831 Chronic kidney disease, stage 3a: Secondary | ICD-10-CM | POA: Diagnosis not present

## 2022-05-09 DIAGNOSIS — E782 Mixed hyperlipidemia: Secondary | ICD-10-CM | POA: Diagnosis not present

## 2022-05-09 DIAGNOSIS — E038 Other specified hypothyroidism: Secondary | ICD-10-CM | POA: Diagnosis not present

## 2022-05-09 DIAGNOSIS — F02818 Dementia in other diseases classified elsewhere, unspecified severity, with other behavioral disturbance: Secondary | ICD-10-CM | POA: Diagnosis not present

## 2022-05-09 DIAGNOSIS — E1122 Type 2 diabetes mellitus with diabetic chronic kidney disease: Secondary | ICD-10-CM | POA: Diagnosis not present

## 2022-05-09 DIAGNOSIS — G301 Alzheimer's disease with late onset: Secondary | ICD-10-CM | POA: Diagnosis not present

## 2022-05-14 ENCOUNTER — Inpatient Hospital Stay (HOSPITAL_COMMUNITY)
Admission: EM | Admit: 2022-05-14 | Discharge: 2022-05-17 | DRG: 871 | Disposition: A | Discharge status: Nursing Home (Includes ICF) | Payer: Medicare Other | Source: Skilled Nursing Facility | Attending: Internal Medicine | Admitting: Internal Medicine

## 2022-05-14 ENCOUNTER — Emergency Department (HOSPITAL_COMMUNITY): Payer: Medicare Other

## 2022-05-14 ENCOUNTER — Encounter (HOSPITAL_COMMUNITY): Payer: Self-pay

## 2022-05-14 ENCOUNTER — Other Ambulatory Visit: Payer: Self-pay

## 2022-05-14 DIAGNOSIS — I959 Hypotension, unspecified: Secondary | ICD-10-CM | POA: Insufficient documentation

## 2022-05-14 DIAGNOSIS — J9601 Acute respiratory failure with hypoxia: Secondary | ICD-10-CM | POA: Diagnosis present

## 2022-05-14 DIAGNOSIS — G309 Alzheimer's disease, unspecified: Secondary | ICD-10-CM | POA: Diagnosis present

## 2022-05-14 DIAGNOSIS — A419 Sepsis, unspecified organism: Principal | ICD-10-CM | POA: Diagnosis present

## 2022-05-14 DIAGNOSIS — I10 Essential (primary) hypertension: Secondary | ICD-10-CM | POA: Diagnosis present

## 2022-05-14 DIAGNOSIS — Z79899 Other long term (current) drug therapy: Secondary | ICD-10-CM

## 2022-05-14 DIAGNOSIS — Z66 Do not resuscitate: Secondary | ICD-10-CM | POA: Diagnosis present

## 2022-05-14 DIAGNOSIS — Z87891 Personal history of nicotine dependence: Secondary | ICD-10-CM

## 2022-05-14 DIAGNOSIS — E538 Deficiency of other specified B group vitamins: Secondary | ICD-10-CM | POA: Diagnosis present

## 2022-05-14 DIAGNOSIS — E872 Acidosis, unspecified: Secondary | ICD-10-CM | POA: Insufficient documentation

## 2022-05-14 DIAGNOSIS — E039 Hypothyroidism, unspecified: Secondary | ICD-10-CM | POA: Diagnosis present

## 2022-05-14 DIAGNOSIS — J690 Pneumonitis due to inhalation of food and vomit: Secondary | ICD-10-CM | POA: Diagnosis present

## 2022-05-14 DIAGNOSIS — F02C0 Dementia in other diseases classified elsewhere, severe, without behavioral disturbance, psychotic disturbance, mood disturbance, and anxiety: Secondary | ICD-10-CM | POA: Diagnosis not present

## 2022-05-14 DIAGNOSIS — G9341 Metabolic encephalopathy: Secondary | ICD-10-CM | POA: Diagnosis present

## 2022-05-14 DIAGNOSIS — Z90710 Acquired absence of both cervix and uterus: Secondary | ICD-10-CM

## 2022-05-14 DIAGNOSIS — D649 Anemia, unspecified: Secondary | ICD-10-CM | POA: Diagnosis present

## 2022-05-14 DIAGNOSIS — E119 Type 2 diabetes mellitus without complications: Secondary | ICD-10-CM | POA: Diagnosis present

## 2022-05-14 DIAGNOSIS — Z7401 Bed confinement status: Secondary | ICD-10-CM | POA: Diagnosis not present

## 2022-05-14 DIAGNOSIS — E860 Dehydration: Secondary | ICD-10-CM | POA: Diagnosis present

## 2022-05-14 DIAGNOSIS — K219 Gastro-esophageal reflux disease without esophagitis: Secondary | ICD-10-CM | POA: Diagnosis present

## 2022-05-14 DIAGNOSIS — R6520 Severe sepsis without septic shock: Secondary | ICD-10-CM

## 2022-05-14 DIAGNOSIS — R0902 Hypoxemia: Secondary | ICD-10-CM | POA: Diagnosis not present

## 2022-05-14 DIAGNOSIS — Z515 Encounter for palliative care: Secondary | ICD-10-CM

## 2022-05-14 DIAGNOSIS — R6521 Severe sepsis with septic shock: Secondary | ICD-10-CM | POA: Diagnosis present

## 2022-05-14 DIAGNOSIS — Z79890 Hormone replacement therapy (postmenopausal): Secondary | ICD-10-CM

## 2022-05-14 DIAGNOSIS — N179 Acute kidney failure, unspecified: Secondary | ICD-10-CM | POA: Diagnosis not present

## 2022-05-14 DIAGNOSIS — R000 Tachycardia, unspecified: Secondary | ICD-10-CM | POA: Diagnosis not present

## 2022-05-14 DIAGNOSIS — R1310 Dysphagia, unspecified: Secondary | ICD-10-CM | POA: Diagnosis present

## 2022-05-14 DIAGNOSIS — L89151 Pressure ulcer of sacral region, stage 1: Secondary | ICD-10-CM | POA: Diagnosis present

## 2022-05-14 DIAGNOSIS — E7800 Pure hypercholesterolemia, unspecified: Secondary | ICD-10-CM | POA: Diagnosis present

## 2022-05-14 DIAGNOSIS — R6889 Other general symptoms and signs: Secondary | ICD-10-CM | POA: Diagnosis not present

## 2022-05-14 DIAGNOSIS — J189 Pneumonia, unspecified organism: Secondary | ICD-10-CM | POA: Diagnosis present

## 2022-05-14 DIAGNOSIS — K5900 Constipation, unspecified: Secondary | ICD-10-CM | POA: Diagnosis present

## 2022-05-14 DIAGNOSIS — F0390 Unspecified dementia without behavioral disturbance: Secondary | ICD-10-CM | POA: Diagnosis not present

## 2022-05-14 DIAGNOSIS — Z96642 Presence of left artificial hip joint: Secondary | ICD-10-CM | POA: Diagnosis present

## 2022-05-14 DIAGNOSIS — E1169 Type 2 diabetes mellitus with other specified complication: Secondary | ICD-10-CM

## 2022-05-14 DIAGNOSIS — Z1152 Encounter for screening for COVID-19: Secondary | ICD-10-CM | POA: Diagnosis not present

## 2022-05-14 DIAGNOSIS — Z7984 Long term (current) use of oral hypoglycemic drugs: Secondary | ICD-10-CM

## 2022-05-14 DIAGNOSIS — L89301 Pressure ulcer of unspecified buttock, stage 1: Secondary | ICD-10-CM | POA: Diagnosis not present

## 2022-05-14 DIAGNOSIS — L8990 Pressure ulcer of unspecified site, unspecified stage: Secondary | ICD-10-CM | POA: Insufficient documentation

## 2022-05-14 DIAGNOSIS — R509 Fever, unspecified: Secondary | ICD-10-CM | POA: Diagnosis not present

## 2022-05-14 DIAGNOSIS — R404 Transient alteration of awareness: Secondary | ICD-10-CM | POA: Diagnosis not present

## 2022-05-14 LAB — CK: Total CK: 39 U/L (ref 38–234)

## 2022-05-14 LAB — CBC WITH DIFFERENTIAL/PLATELET
Abs Immature Granulocytes: 0.05 10*3/uL (ref 0.00–0.07)
Basophils Absolute: 0.1 10*3/uL (ref 0.0–0.1)
Basophils Relative: 1 %
Eosinophils Absolute: 0 10*3/uL (ref 0.0–0.5)
Eosinophils Relative: 0 %
HCT: 38.8 % (ref 36.0–46.0)
Hemoglobin: 12.4 g/dL (ref 12.0–15.0)
Immature Granulocytes: 0 %
Lymphocytes Relative: 6 %
Lymphs Abs: 0.7 10*3/uL (ref 0.7–4.0)
MCH: 31.6 pg (ref 26.0–34.0)
MCHC: 32 g/dL (ref 30.0–36.0)
MCV: 99 fL (ref 80.0–100.0)
Monocytes Absolute: 1.1 10*3/uL — ABNORMAL HIGH (ref 0.1–1.0)
Monocytes Relative: 9 %
Neutro Abs: 9.9 10*3/uL — ABNORMAL HIGH (ref 1.7–7.7)
Neutrophils Relative %: 84 %
Platelets: 269 10*3/uL (ref 150–400)
RBC: 3.92 MIL/uL (ref 3.87–5.11)
RDW: 13.7 % (ref 11.5–15.5)
WBC: 11.8 10*3/uL — ABNORMAL HIGH (ref 4.0–10.5)
nRBC: 0 % (ref 0.0–0.2)

## 2022-05-14 LAB — URINALYSIS, W/ REFLEX TO CULTURE (INFECTION SUSPECTED)
Bacteria, UA: NONE SEEN
Bilirubin Urine: NEGATIVE
Glucose, UA: >=500 mg/dL — AB
Hgb urine dipstick: NEGATIVE
Ketones, ur: 5 mg/dL — AB
Leukocytes,Ua: NEGATIVE
Nitrite: NEGATIVE
Protein, ur: NEGATIVE mg/dL
Specific Gravity, Urine: 1.009 (ref 1.005–1.030)
pH: 7 (ref 5.0–8.0)

## 2022-05-14 LAB — COMPREHENSIVE METABOLIC PANEL
ALT: 11 U/L (ref 0–44)
AST: 23 U/L (ref 15–41)
Albumin: 3.5 g/dL (ref 3.5–5.0)
Alkaline Phosphatase: 60 U/L (ref 38–126)
Anion gap: 13 (ref 5–15)
BUN: 28 mg/dL — ABNORMAL HIGH (ref 8–23)
CO2: 24 mmol/L (ref 22–32)
Calcium: 9.3 mg/dL (ref 8.9–10.3)
Chloride: 101 mmol/L (ref 98–111)
Creatinine, Ser: 1.07 mg/dL — ABNORMAL HIGH (ref 0.44–1.00)
GFR, Estimated: 53 mL/min — ABNORMAL LOW (ref >60)
Glucose, Bld: 205 mg/dL — ABNORMAL HIGH (ref 70–99)
Potassium: 4.2 mmol/L (ref 3.5–5.1)
Sodium: 138 mmol/L (ref 135–145)
Total Bilirubin: 1 mg/dL (ref 0.3–1.2)
Total Protein: 6.8 g/dL (ref 6.5–8.1)

## 2022-05-14 LAB — I-STAT VENOUS BLOOD GAS, ED
Acid-base deficit: 2 mmol/L (ref 0.0–2.0)
Bicarbonate: 24 mmol/L (ref 20.0–28.0)
Calcium, Ion: 1.19 mmol/L (ref 1.15–1.40)
HCT: 34 % — ABNORMAL LOW (ref 36.0–46.0)
Hemoglobin: 11.6 g/dL — ABNORMAL LOW (ref 12.0–15.0)
O2 Saturation: 71 %
Potassium: 4.1 mmol/L (ref 3.5–5.1)
Sodium: 138 mmol/L (ref 135–145)
TCO2: 25 mmol/L (ref 22–32)
pCO2, Ven: 46.9 mmHg (ref 44–60)
pH, Ven: 7.318 (ref 7.25–7.43)
pO2, Ven: 41 mmHg (ref 32–45)

## 2022-05-14 LAB — TROPONIN I (HIGH SENSITIVITY): Troponin I (High Sensitivity): 7 ng/L (ref <18)

## 2022-05-14 LAB — PHOSPHORUS: Phosphorus: 3.1 mg/dL (ref 2.5–4.6)

## 2022-05-14 LAB — RESP PANEL BY RT-PCR (RSV, FLU A&B, COVID)  RVPGX2
Influenza A by PCR: NEGATIVE
Influenza B by PCR: NEGATIVE
Resp Syncytial Virus by PCR: NEGATIVE
SARS Coronavirus 2 by RT PCR: NEGATIVE

## 2022-05-14 LAB — LACTIC ACID, PLASMA
Lactic Acid, Venous: 3.8 mmol/L (ref 0.5–1.9)
Lactic Acid, Venous: 4.6 mmol/L (ref 0.5–1.9)

## 2022-05-14 LAB — CBG MONITORING, ED: Glucose-Capillary: 207 mg/dL — ABNORMAL HIGH (ref 70–99)

## 2022-05-14 LAB — VALPROIC ACID LEVEL: Valproic Acid Lvl: 25 ug/mL — ABNORMAL LOW (ref 50.0–100.0)

## 2022-05-14 LAB — PROTIME-INR
INR: 1.1 (ref 0.8–1.2)
Prothrombin Time: 14.5 seconds (ref 11.4–15.2)

## 2022-05-14 LAB — STREP PNEUMONIAE URINARY ANTIGEN: Strep Pneumo Urinary Antigen: NEGATIVE

## 2022-05-14 LAB — MAGNESIUM: Magnesium: 1.3 mg/dL — ABNORMAL LOW (ref 1.7–2.4)

## 2022-05-14 LAB — SODIUM, URINE, RANDOM: Sodium, Ur: 157 mmol/L

## 2022-05-14 LAB — OSMOLALITY, URINE: Osmolality, Ur: 505 mOsm/kg (ref 300–900)

## 2022-05-14 LAB — APTT: aPTT: 31 seconds (ref 24–36)

## 2022-05-14 LAB — CREATININE, URINE, RANDOM: Creatinine, Urine: 21 mg/dL

## 2022-05-14 LAB — TSH: TSH: 1.044 u[IU]/mL (ref 0.350–4.500)

## 2022-05-14 MED ORDER — LACTATED RINGERS IV BOLUS (SEPSIS)
1000.0000 mL | Freq: Once | INTRAVENOUS | Status: AC
  Administered 2022-05-14: 1000 mL via INTRAVENOUS

## 2022-05-14 MED ORDER — VANCOMYCIN HCL 1250 MG/250ML IV SOLN: 1250.0000 mg | INTRAVENOUS | Status: DC

## 2022-05-14 MED ORDER — ONDANSETRON HCL 4 MG/2ML IJ SOLN: 4.0000 mg | Freq: Four times a day (QID) | INTRAMUSCULAR | Status: DC | PRN

## 2022-05-14 MED ORDER — ONDANSETRON 4 MG PO TBDP: 4.0000 mg | ORAL_TABLET | Freq: Four times a day (QID) | ORAL | Status: DC | PRN

## 2022-05-14 MED ORDER — GLYCOPYRROLATE 0.2 MG/ML IJ SOLN: 0.2000 mg | INTRAMUSCULAR | Status: DC | PRN

## 2022-05-14 MED ORDER — ONDANSETRON HCL 4 MG/2ML IJ SOLN
4.0000 mg | Freq: Once | INTRAMUSCULAR | Status: AC
  Administered 2022-05-14: 4 mg via INTRAVENOUS
  Filled 2022-05-14: qty 2

## 2022-05-14 MED ORDER — MORPHINE SULFATE (PF) 2 MG/ML IV SOLN
1.0000 mg | INTRAVENOUS | Status: DC | PRN
  Administered 2022-05-14: 1 mg via INTRAVENOUS
  Filled 2022-05-14: qty 1

## 2022-05-14 MED ORDER — LACTATED RINGERS IV BOLUS (SEPSIS)
500.0000 mL | Freq: Once | INTRAVENOUS | Status: AC
Start: 2022-05-14 — End: 2022-05-14
  Administered 2022-05-14: 500 mL via INTRAVENOUS

## 2022-05-14 MED ORDER — HALOPERIDOL LACTATE 2 MG/ML PO CONC: 0.5000 mg | ORAL | Status: DC | PRN

## 2022-05-14 MED ORDER — SODIUM CHLORIDE 0.9 % IV SOLN: 2.0000 g | Freq: Two times a day (BID) | INTRAVENOUS | Status: DC

## 2022-05-14 MED ORDER — ACETAMINOPHEN 325 MG PO TABS: 650.0000 mg | ORAL_TABLET | Freq: Four times a day (QID) | ORAL | Status: DC | PRN

## 2022-05-14 MED ORDER — VANCOMYCIN HCL IN DEXTROSE 1-5 GM/200ML-% IV SOLN
1000.0000 mg | Freq: Once | INTRAVENOUS | Status: AC
  Administered 2022-05-14: 1000 mg via INTRAVENOUS
  Filled 2022-05-14: qty 200

## 2022-05-14 MED ORDER — GLYCOPYRROLATE 1 MG PO TABS: 1.0000 mg | ORAL_TABLET | ORAL | Status: DC | PRN

## 2022-05-14 MED ORDER — HALOPERIDOL LACTATE 5 MG/ML IJ SOLN: 0.5000 mg | INTRAMUSCULAR | Status: DC | PRN

## 2022-05-14 MED ORDER — LACTATED RINGERS IV SOLN: INTRAVENOUS | Status: DC

## 2022-05-14 MED ORDER — METRONIDAZOLE 500 MG/100ML IV SOLN
500.0000 mg | Freq: Once | INTRAVENOUS | Status: AC
  Administered 2022-05-14: 500 mg via INTRAVENOUS
  Filled 2022-05-14: qty 100

## 2022-05-14 MED ORDER — INSULIN ASPART 100 UNIT/ML IJ SOLN
0.0000 [IU] | INTRAMUSCULAR | Status: DC
  Administered 2022-05-14: 3 [IU] via SUBCUTANEOUS
  Administered 2022-05-15 (×2): 1 [IU] via SUBCUTANEOUS

## 2022-05-14 MED ORDER — BIOTENE DRY MOUTH MT LIQD: 15.0000 mL | OROMUCOSAL | Status: DC | PRN

## 2022-05-14 MED ORDER — HALOPERIDOL 1 MG PO TABS: 0.5000 mg | ORAL_TABLET | ORAL | Status: DC | PRN

## 2022-05-14 MED ORDER — GLYCOPYRROLATE 0.2 MG/ML IJ SOLN
0.2000 mg | INTRAMUSCULAR | Status: DC | PRN
  Administered 2022-05-15: 0.2 mg via INTRAVENOUS
  Filled 2022-05-14 (×2): qty 1

## 2022-05-14 MED ORDER — ACETAMINOPHEN 650 MG RE SUPP
650.0000 mg | Freq: Once | RECTAL | Status: AC
  Administered 2022-05-14: 650 mg via RECTAL
  Filled 2022-05-14: qty 1

## 2022-05-14 MED ORDER — CEFEPIME HCL 2 G IV SOLR
2.0000 g | Freq: Once | INTRAVENOUS | Status: AC
Start: 2022-05-14 — End: 2022-05-14
  Administered 2022-05-14: 2 g via INTRAVENOUS
  Filled 2022-05-14: qty 12.5

## 2022-05-14 MED ORDER — ALBUTEROL SULFATE (2.5 MG/3ML) 0.083% IN NEBU: 2.5000 mg | INHALATION_SOLUTION | RESPIRATORY_TRACT | Status: DC | PRN

## 2022-05-14 MED ORDER — ACETAMINOPHEN 500 MG PO TABS: 500.0000 mg | ORAL_TABLET | Freq: Once | ORAL | Status: DC

## 2022-05-14 MED ORDER — ACETAMINOPHEN 650 MG RE SUPP
650.0000 mg | Freq: Four times a day (QID) | RECTAL | Status: DC | PRN
  Administered 2022-05-15: 650 mg via RECTAL
  Filled 2022-05-14: qty 1

## 2022-05-14 MED ORDER — POLYVINYL ALCOHOL 1.4 % OP SOLN: 1.0000 [drp] | Freq: Four times a day (QID) | OPHTHALMIC | Status: DC | PRN

## 2022-05-14 MED ORDER — SODIUM CHLORIDE 0.9 % IV SOLN
3.0000 g | Freq: Four times a day (QID) | INTRAVENOUS | Status: DC
  Administered 2022-05-14 – 2022-05-16 (×6): 3 g via INTRAVENOUS
  Filled 2022-05-14 (×6): qty 8

## 2022-05-14 MED ORDER — LACTATED RINGERS IV SOLN: 150.0000 mL/h | INTRAVENOUS | Status: DC

## 2022-05-14 MED ORDER — LACTATED RINGERS IV BOLUS (SEPSIS)
250.0000 mL | Freq: Once | INTRAVENOUS | Status: AC
Start: 2022-05-14 — End: 2022-05-14
  Administered 2022-05-14: 250 mL via INTRAVENOUS

## 2022-05-14 MED ORDER — SODIUM CHLORIDE 0.9 % IV SOLN: INTRAVENOUS | Status: DC

## 2022-05-14 NOTE — Assessment & Plan Note (Addendum)
Treat with Unasyn  Discussed with family at this point leaning much more closer to comfort care.  Okay to provide comfort foods as needed

## 2022-05-14 NOTE — Subjective & Objective (Signed)
Patient comes from [facility] with confusion when staff went to check on the patient she was making gurgling sounds at her baseline she able to talk.  On arrival pulse 122 satting 91% on room air started on 2 L found to be febrile in ER

## 2022-05-14 NOTE — ED Notes (Signed)
Dr. Sherry Ruffing notified of elevated temp

## 2022-05-14 NOTE — ED Triage Notes (Signed)
Pt BIB [facility] from [facility] with c/o of AMS. Staff went to go check on resident and she was making gurgling sounds so EMS was called. Resident's baseline she normally talks.   EMS VS BP 176/86 P 122 R 30 O2 91% RA/ 96% 2L CBG 216

## 2022-05-14 NOTE — ED Notes (Signed)
RN spoke to son to give update.... [place] is [place] in [place] [employer] at the moment... plz call if anything changes.Marland Kitchen.(info in chart)

## 2022-05-14 NOTE — Assessment & Plan Note (Signed)
-  SIRS criteria met with elevated white blood cell count,       Component Value Date/Time   WBC 11.8 (H) 05/14/2022 1700   LYMPHSABS 0.7 05/14/2022 1700     tachycardia   ,   Fever  RR >20 Today's Vitals   05/14/22 1830 05/14/22 1845 05/14/22 1940 05/14/22 2007  BP: (!) 166/84 (!) 159/87  (!) 161/91  Pulse: (!) 120 (!) 119  (!) 127  Resp:  (!) 30  (!) 37  Temp:   100.2 F (37.9 C)   TempSrc:   Rectal   SpO2: (!) 88% (!) 89%  95%  Weight:      Height:       Body mass index is 21.29 kg/m.     The recent clinical data is shown below. Vitals:   05/14/22 1830 05/14/22 1845 05/14/22 1940 05/14/22 2007  BP: (!) 166/84 (!) 159/87  (!) 161/91  Pulse: (!) 120 (!) 119  (!) 127  Resp:  (!) 30  (!) 37  Temp:   100.2 F (37.9 C)   TempSrc:   Rectal   SpO2: (!) 88% (!) 89%  95%  Weight:      Height:         -Most likely source being: , pulmonary,    Patient meeting criteria for Severe sepsis with    evidence of end organ damage/organ dysfunction such as    elevated lactic acid >2     Component Value Date/Time   LATICACIDVEN 4.6 (HH) 05/14/2022 [id number]    acute metabolic encephalopathy  [id number] mmhg or MAP < 65 mmhg,   Acute hypoxia requiring new supplemental oxygen, SpO2: 95 % O2 Flow Rate (L/min): 5 L/min     *Patient is meeting criteria for SEPTIC SHOCK with  lactic acid > 4     - Obtain serial lactic acid and procalcitonin level.  - Initiated IV antibiotics in ER: Antibiotics Given (last 72 hours)    Date/Time Action Medication Dose Rate   05/14/22 1726 New Bag/Given   ceFEPIme (MAXIPIME) 2 g in sodium chloride 0.9 % 100 mL IVPB 2 g 200 mL/hr   05/14/22 1741 New Bag/Given   metroNIDAZOLE (FLAGYL) IVPB 500 mg 500 mg 100 mL/hr   05/14/22 1812 New Bag/Given   vancomycin (VANCOCIN) IVPB 1000 mg/200 mL premix 1,000 mg 200 mL/hr      Will continue  on :    - await results of blood and urine culture  - Rehydrate aggressively  Intravenous fluids were administered,    9:27 PM

## 2022-05-14 NOTE — Assessment & Plan Note (Signed)
Chronic monitor for sundowning

## 2022-05-14 NOTE — Progress Notes (Addendum)
Pharmacy Antibiotic Note  Beth Pennington is a 82 y.o. female for which pharmacy has been consulted for cefepime and vancomycin dosing for sepsis.  Patient with a history of Alzheimer's, DM, HTN, thyroid diseas, hypercholesterolemia. Patient presenting with AMS.  SCr 1.07 WBC 11.8; LA 3.8; T 103.6; HR 112; RR 28 COVID neg / flu neg  Plan: Metronidazole per MD Cefepime 2g q12hr  Vancomycin 1000 mg once in ED --Will start 1250 mg q48hr (eAUC 514.9) unless change in renal function Trend WBC, Fever, Renal function F/u cultures, clinical course, WBC De-escalate when able Levels at steady state  ADDENDUM: TRH transitioning to unasyn for aspiration pna coverage now STOP cefepime/vanco START unasyn 3g q6h CrCl is borderline for needing dose modification -- pharmacy to monitor Lorelei Pont, PharmD, BCPS 05/14/2022 9:55 PM ED Clinical Pharmacist -  [phone]   Height: '5\' 2"'$  (157.5 cm) Weight: 52.8 kg (116 lb 6.5 oz) IBW/kg (Calculated) : 50.1  No data recorded.  No results for input(s): "WBC", "CREATININE", "LATICACIDVEN", "VANCOTROUGH", "VANCOPEAK", "VANCORANDOM", "GENTTROUGH", "GENTPEAK", "GENTRANDOM", "TOBRATROUGH", "TOBRAPEAK", "TOBRARND", "AMIKACINPEAK", "AMIKACINTROU", "AMIKACIN" in the last 168 hours.  CrCl cannot be calculated (Patient's most recent lab result is older than the maximum 21 days allowed.).    No Known Allergies  Microbiology results: Pending  Thank you for allowing pharmacy to be a part of this patient's care.  Lorelei Pont, PharmD, BCPS 05/14/2022 4:56 PM ED Clinical Pharmacist -  [phone]

## 2022-05-14 NOTE — Assessment & Plan Note (Addendum)
ORDER SLIDING SCALE Hold metformin

## 2022-05-14 NOTE — Sepsis Progress Note (Signed)
Sepsis protocol is being followed by [facility]. 

## 2022-05-14 NOTE — Assessment & Plan Note (Addendum)
this patient has acute respiratory failure with Hypoxia as documented by the presence of following: O2 saturatio< 90% on RA Likely due to:   Pneumonia  Provide O2 therapy and titrate as needed  Continuous pulse ox Family is transitioning to comfort continue to provide oxygen by facemask for support. Low-dose morphine as needed air hunger

## 2022-05-14 NOTE — Assessment & Plan Note (Addendum)
-   most likely multifactorial secondary to combination of infection  mild dehydration secondary to decreased by mouth intake,  polypharmacy   - Will rehydrate   - treat underlining infection   - Hold contributing medications   - if no improvement may need further imaging to evaluate for CNS pathology pathology such as MRI of the brain   - neurological exam appears to be nonfocal but patient unable to cooperate full  Patient is not transitioning to comfort

## 2022-05-14 NOTE — ED Notes (Signed)
ED TO INPATIENT HANDOFF REPORT  ED Nurse Name and Phone #: Quintin Hjort/ [phone]  S Name/Age/Gender Beth Pennington 82 y.o. female Room/Bed: 018C/018C  Code Status   Code Status: DNR  Home/SNF/Other Nursing Home Patient oriented to: disoriented x 4 since she got here. She normally talks so this is not her baseline. Is this baseline? No   Triage Complete: Triage complete  Chief Complaint Severe sepsis ([place]) [A41.9, R65.20]  Triage Note Pt BIB [facility] from [employer] with c/o of AMS. Staff went to go check on resident and she was making gurgling sounds so EMS was called. Resident's baseline she normally talks.   EMS VS BP 176/86 P 122 R 30 O2 91% RA/ 96% 2L CBG 216   Allergies No Known Allergies  Level of Care/Admitting Diagnosis ED Disposition     ED Disposition  Admit   Condition  --   Comment  Hospital Area: [place] [[id number]]  Level of Care: Progressive [102]  Admit to Progressive based on following criteria: MULTISYSTEM THREATS such as stable sepsis, metabolic/electrolyte imbalance with or without encephalopathy that is responding to early treatment.  May admit patient to [facility] or [facility] if equivalent level of care is available:: No  Covid Evaluation: Confirmed COVID Negative  Diagnosis: Severe sepsis [facility][id number]  Admitting Physician: Toy Baker [id number]  Attending Physician: Toy Baker [id number]  Certification:: I certify this patient will need inpatient services for at least 2 midnights  Estimated Length of Stay: 2          B Medical/Surgery History Past Medical History:  Diagnosis Date   Constipation    Dementia ([place])    Diabetes mellitus without complication ([place])    Hypercholesterolemia    Hypertension    Thyroid disease    Vitamin B12 deficiency    Past Surgical History:  Procedure Laterality Date   ABDOMINAL HYSTERECTOMY     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    Procedure: TOTAL HIP ARTHROPLASTY ANTERIOR APPROACH;  Surgeon: Rod Can, MD;  Location: [facility];  Service: Orthopedics;  Laterality: Left;     A IV Location/Drains/Wounds Patient Lines/Drains/Airways Status     Active Line/Drains/Airways     Name Placement date Placement time Site Days   Peripheral IV 05/14/22 20 G Anterior;Left Forearm 05/14/22  1700  Forearm  less than 1   Peripheral IV 05/14/22 20 G Anterior;Right Forearm 05/14/22  1701  Forearm  less than 1   External Urinary Catheter 08/16/21  2000  --  271   Incision (Closed) 08/17/21 Hip Left 08/17/21  1416  -- 270            Intake/Output Last 24 hours  Intake/Output Summary (Last 24 hours) at 05/14/2022 2245 Last data filed at 05/14/2022 2235 Gross per 24 hour  Intake 2512.47 ml  Output 50 ml  Net 2462.47 ml    Labs/Imaging Results for orders placed or performed during the hospital encounter of 05/14/22 (from the past 48 hour(s))  Resp panel by RT-PCR (RSV, Flu A&B, Covid) Anterior Nasal Swab     Status: None   Collection Time: 05/14/22  4:51 PM   Specimen: Anterior Nasal Swab  Result Value Ref Range   SARS Coronavirus 2 by RT PCR NEGATIVE NEGATIVE   Influenza A by PCR NEGATIVE NEGATIVE   Influenza B by PCR NEGATIVE NEGATIVE    Comment: (NOTE) The Xpert Xpress SARS-CoV-2/FLU/RSV plus assay is intended as an aid in the diagnosis of influenza  from Nasopharyngeal swab specimens and should not be used as a sole basis for treatment. Nasal washings and aspirates are unacceptable for Xpert Xpress SARS-CoV-2/FLU/RSV testing.  Fact Sheet for Patients: EntrepreneurPulse.com.au  Fact Sheet for Healthcare Providers: IncredibleEmployment.be  This test is not yet approved or cleared by the [place] FDA and has been authorized for detection and/or diagnosis of SARS-CoV-2 by FDA under an Emergency Use Authorization (EUA). This EUA will remain in effect (meaning this test can  be used) for the duration of the COVID-19 declaration under Section 564(b)(1) of the Act, 21 U.S.C. section 360bbb-3(b)(1), unless the authorization is terminated or revoked.     Resp Syncytial Virus by PCR NEGATIVE NEGATIVE    Comment: (NOTE) Fact Sheet for Patients: EntrepreneurPulse.com.au  Fact Sheet for Healthcare Providers: IncredibleEmployment.be  This test is not yet approved or cleared by the [place] FDA and has been authorized for detection and/or diagnosis of SARS-CoV-2 by FDA under an Emergency Use Authorization (EUA). This EUA will remain in effect (meaning this test can be used) for the duration of the COVID-19 declaration under Section 564(b)(1) of the Act, 21 U.S.C. section 360bbb-3(b)(1), unless the authorization is terminated or revoked.  Performed at [facility], [place] [address], [place], [place] [address]   Lactic acid, plasma     Status: Abnormal   Collection Time: 05/14/22  5:00 PM  Result Value Ref Range   Lactic Acid, Venous 3.8 (HH) 0.5 - 1.9 mmol/L    Comment: CRITICAL RESULT CALLED TO, READ BACK BY AND VERIFIED WITH Wayne Sever, RN @ [phone] 05/14/22 BY [facility] Performed at [facility], [place] [address] [place], [place] [address]   Comprehensive metabolic panel     Status: Abnormal   Collection Time: 05/14/22  5:00 PM  Result Value Ref Range   Sodium 138 135 - 145 mmol/L   Potassium 4.2 3.5 - 5.1 mmol/L   Chloride 101 98 - 111 mmol/L   CO2 24 22 - 32 mmol/L   Glucose, Bld 205 (H) 70 - 99 mg/dL    Comment: Glucose reference range applies only to samples taken after fasting for at least 8 hours.   BUN 28 (H) 8 - 23 mg/dL   Creatinine, Ser 1.07 (H) 0.44 - 1.00 mg/dL   Calcium 9.3 8.9 - 10.3 mg/dL   Total Protein 6.8 6.5 - 8.1 g/dL   Albumin 3.5 3.5 - 5.0 g/dL   AST 23 15 - 41 U/L   ALT 11 0 - 44 U/L   Alkaline Phosphatase 60 38 - 126 U/L   Total Bilirubin 1.0 0.3 - 1.2 mg/dL   GFR, Estimated 53  (L) >60 mL/min    Comment: (NOTE) Calculated using the CKD-EPI Creatinine Equation (2021)    Anion gap 13 5 - 15    Comment: Performed at [facility] [address], [place], [place] [address]  CBC with Differential     Status: Abnormal   Collection Time: 05/14/22  5:00 PM  Result Value Ref Range   WBC 11.8 (H) 4.0 - 10.5 K/uL   RBC 3.92 3.87 - 5.11 MIL/uL   Hemoglobin 12.4 12.0 - 15.0 g/dL   HCT 38.8 36.0 - 46.0 %   MCV 99.0 80.0 - 100.0 fL   MCH 31.6 26.0 - 34.0 pg   MCHC 32.0 30.0 - 36.0 g/dL   RDW 13.7 11.5 - 15.5 %   Platelets 269 150 - 400 K/uL   nRBC 0.0 0.0 -  0.2 %   Neutrophils Relative % 84 %   Neutro Abs 9.9 (H) 1.7 - 7.7 K/uL   Lymphocytes Relative 6 %   Lymphs Abs 0.7 0.7 - 4.0 K/uL   Monocytes Relative 9 %   Monocytes Absolute 1.1 (H) 0.1 - 1.0 K/uL   Eosinophils Relative 0 %   Eosinophils Absolute 0.0 0.0 - 0.5 K/uL   Basophils Relative 1 %   Basophils Absolute 0.1 0.0 - 0.1 K/uL   Immature Granulocytes 0 %   Abs Immature Granulocytes 0.05 0.00 - 0.07 K/uL    Comment: Performed at [facility] [address], [place], [place] [address]  Protime-INR     Status: None   Collection Time: 05/14/22  5:00 PM  Result Value Ref Range   Prothrombin Time 14.5 11.4 - 15.2 seconds   INR 1.1 0.8 - 1.2    Comment: (NOTE) INR goal varies based on device and disease states. Performed at [facility], [place] [address] [place], [place] [address]   APTT     Status: None   Collection Time: 05/14/22  5:00 PM  Result Value Ref Range   aPTT 31 24 - 36 seconds    Comment: Performed at [facility] [address] [place], [place] [address]  TSH     Status: None   Collection Time: 05/14/22  5:00 PM  Result Value Ref Range   TSH 1.044 0.350 - 4.500 uIU/mL    Comment: Performed by a 3rd Generation assay with a functional sensitivity of <=0.01 uIU/mL. Performed at [facility], [place] [address] [place], [place] [address]   Lactic acid, plasma      Status: Abnormal   Collection Time: 05/14/22  6:50 PM  Result Value Ref Range   Lactic Acid, Venous 4.6 (HH) 0.5 - 1.9 mmol/L    Comment: CRITICAL VALUE NOTED. VALUE IS CONSISTENT WITH PREVIOUSLY REPORTED/CALLED VALUE Performed at [facility], [place] [address], [place], [place] [address]   Urinalysis, w/ Reflex to Culture (Infection Suspected) -Urine, Clean Catch     Status: Abnormal   Collection Time: 05/14/22  9:07 PM  Result Value Ref Range   Specimen Source URINE, CLEAN CATCH    Color, Urine YELLOW YELLOW   APPearance HAZY (A) CLEAR   Specific Gravity, Urine 1.009 1.005 - 1.030   pH 7.0 5.0 - 8.0   Glucose, UA >=500 (A) NEGATIVE mg/dL   Hgb urine dipstick NEGATIVE NEGATIVE   Bilirubin Urine NEGATIVE NEGATIVE   Ketones, ur 5 (A) NEGATIVE mg/dL   Protein, ur NEGATIVE NEGATIVE mg/dL   Nitrite NEGATIVE NEGATIVE   Leukocytes,Ua NEGATIVE NEGATIVE   RBC / HPF 0-5 0 - 5 RBC/hpf   WBC, UA 0-5 0 - 5 WBC/hpf    Comment:        Reflex urine culture not performed if WBC <=10, OR if Squamous epithelial cells >5. If Squamous epithelial cells >5 suggest recollection.    Bacteria, UA NONE SEEN NONE SEEN   Squamous Epithelial / HPF 6-10 0 - 5 /HPF   Mucus PRESENT     Comment: Performed at [facility], [place] [address] [place], [place] [address]  Creatinine, urine, random     Status: None   Collection Time: 05/14/22  9:07 PM  Result Value Ref Range   Creatinine, Urine 21 mg/dL    Comment: Performed at [facility] [address], [place],  [address]  Sodium,  urine, random     Status: None   Collection Time: 05/14/22  9:07 PM  Result Value Ref Range   Sodium, Ur 157 mmol/L    Comment: Performed at [facility] [address], [place], [place] [address]  CBG monitoring, ED     Status: Abnormal   Collection Time: 05/14/22 10:03 PM  Result Value Ref Range   Glucose-Capillary 207 (H) 70 - 99 mg/dL    Comment: Glucose reference range applies only to samples  taken after fasting for at least 8 hours.  I-Stat venous blood gas, ED     Status: Abnormal   Collection Time: 05/14/22 10:19 PM  Result Value Ref Range   pH, Ven 7.318 7.25 - 7.43   pCO2, Ven 46.9 44 - 60 mmHg   pO2, Ven 41 32 - 45 mmHg   Bicarbonate 24.0 20.0 - 28.0 mmol/L   TCO2 25 22 - 32 mmol/L   O2 Saturation 71 %   Acid-base deficit 2.0 0.0 - 2.0 mmol/L   Sodium 138 135 - 145 mmol/L   Potassium 4.1 3.5 - 5.1 mmol/L   Calcium, Ion 1.19 1.15 - 1.40 mmol/L   HCT 34.0 (L) 36.0 - 46.0 %   Hemoglobin 11.6 (L) 12.0 - 15.0 g/dL   Sample type VENOUS    CT HEAD WO CONTRAST (5MM)  Result Date: 05/14/2022 CLINICAL DATA:  Mental status change, unknown cause EXAM: CT HEAD WITHOUT CONTRAST TECHNIQUE: Contiguous axial images were obtained from the base of the skull through the vertex without intravenous contrast. RADIATION DOSE REDUCTION: This exam was performed according to the departmental dose-optimization program which includes automated exposure control, adjustment of the mA and/or kV according to patient size and/or use of iterative reconstruction technique. COMPARISON:  None Available. FINDINGS: Brain: No evidence of acute infarction, hemorrhage, hydrocephalus, extra-axial collection or mass lesion/mass effect. Vascular: No hyperdense vessel. Skull: No acute fracture. Sinuses/Orbits: Clear sinuses.  No acute orbital finding. Other: No mastoid effusions. IMPRESSION: No evidence of acute intracranial abnormality. Electronically Signed   By: Margaretha Sheffield M.D.   On: 05/14/2022 19:28   DG Chest Port 1 View  Result Date: 05/14/2022 CLINICAL DATA:  Occlusion or sepsis. EXAM: PORTABLE CHEST 1 VIEW COMPARISON:  Chest radiograph dated 08/15/2021. FINDINGS: Right infrahilar density may represent atelectasis or developing infiltrate. Clinical correlation is recommended. The left lung is clear. No pleural effusion pneumothorax. The cardiac silhouette is within limits. Atherosclerotic calcification of the  aorta. No acute osseous pathology. IMPRESSION: Right infrahilar density may represent atelectasis or developing infiltrate. Electronically Signed   By: Anner Crete M.D.   On: 05/14/2022 17:12    Pending Labs Unresulted Labs (From admission, onward)     Start     Ordered   05/15/22 0500  Prealbumin  Tomorrow morning,   R        05/14/22 2134   05/14/22 2220  Osmolality  Once,   AD        05/14/22 2220   05/14/22 2220  Hemoglobin A1c  Once,   R       Comments: To assess prior glycemic control    05/14/22 2220   05/14/22 2140  Valproic acid level  Once,   R        05/14/22 2139   05/14/22 2134  CK  Add-on,   AD        05/14/22 2134   05/14/22 2134  Magnesium  Add-on,   AD  05/14/22 2134   05/14/22 2134  Phosphorus  Add-on,   AD        05/14/22 2134   05/14/22 2134  Osmolality, urine  Once,   R        05/14/22 2134   05/14/22 2132  Procalcitonin  ONCE - URGENT,   URGENT       References:    Procalcitonin Lower Respiratory Tract Infection AND Sepsis Procalcitonin Algorithm   05/14/22 2131   05/14/22 2131  Expectorated Sputum Assessment w Gram Stain, Rflx to Resp Cult  Once,   R        05/14/22 2131   05/14/22 2131  Legionella Pneumophila Serogp 1 Ur Ag  Once,   URGENT        05/14/22 2131   05/14/22 2131  Strep pneumoniae urinary antigen  Once,   URGENT        05/14/22 2131   05/14/22 2130  Lactic acid, plasma  STAT Now then every 2 hours,   R (with STAT occurrences)      05/14/22 2131   05/14/22 1651  Blood Culture (routine x 2)  (Septic presentation on arrival (screening labs, nursing and treatment orders for obvious sepsis))  BLOOD CULTURE X 2,   STAT      05/14/22 1652   Signed and Held  Comprehensive metabolic panel  Tomorrow morning,   R       Question:  Release to patient  Answer:  Immediate   Signed and Held   Signed and Held  CBC  Tomorrow morning,   R       Question:  Release to patient  Answer:  Immediate   Signed and Held   Signed and Held  Magnesium   Tomorrow morning,   R        Signed and Held   Signed and Held  Phosphorus  Tomorrow morning,   R        Signed and Held            Vitals/Pain Today's Vitals   05/14/22 2030 05/14/22 2045 05/14/22 2115 05/14/22 2130  BP: (!) 145/65 121/77 (!) 142/100 (!) 141/102  Pulse: (!) 120 (!) 123 (!) 129 (!) 128  Resp: (!) 40 (!) 41 (!) 37 (!) 41  Temp:      TempSrc:      SpO2: 96% 97% 91% 91%  Weight:      Height:        Isolation Precautions No active isolations  Medications Medications  insulin aspart (novoLOG) injection 0-9 Units (has no administration in time range)  Ampicillin-Sulbactam (UNASYN) 3 g in sodium chloride 0.9 % 100 mL IVPB (has no administration in time range)  0.9 %  sodium chloride infusion (has no administration in time range)  morphine (PF) 2 MG/ML injection 1 mg (has no administration in time range)  acetaminophen (TYLENOL) tablet 650 mg (has no administration in time range)    Or  acetaminophen (TYLENOL) suppository 650 mg (has no administration in time range)  ondansetron (ZOFRAN-ODT) disintegrating tablet 4 mg (has no administration in time range)    Or  ondansetron (ZOFRAN) injection 4 mg (has no administration in time range)  glycopyrrolate (ROBINUL) tablet 1 mg (has no administration in time range)    Or  glycopyrrolate (ROBINUL) injection 0.2 mg (has no administration in time range)    Or  glycopyrrolate (ROBINUL) injection 0.2 mg (has no administration in time range)  antiseptic oral rinse (BIOTENE) solution 15 mL (  has no administration in time range)  polyvinyl alcohol (LIQUIFILM TEARS) 1.4 % ophthalmic solution 1 drop (has no administration in time range)  albuterol (PROVENTIL) (2.5 MG/3ML) 0.083% nebulizer solution 2.5 mg (has no administration in time range)  haloperidol (HALDOL) tablet 0.5 mg (has no administration in time range)    Or  haloperidol (HALDOL) 2 MG/ML solution 0.5 mg (has no administration in time range)    Or  haloperidol  lactate (HALDOL) injection 0.5 mg (has no administration in time range)  lactated ringers bolus 1,000 mL (0 mLs Intravenous Stopped 05/14/22 1835)    And  lactated ringers bolus 500 mL (0 mLs Intravenous Stopped 05/14/22 1856)    And  lactated ringers bolus 250 mL (0 mLs Intravenous Stopped 05/14/22 1855)  ceFEPIme (MAXIPIME) 2 g in sodium chloride 0.9 % 100 mL IVPB (0 g Intravenous Stopped 05/14/22 1810)  metroNIDAZOLE (FLAGYL) IVPB 500 mg (0 mg Intravenous Stopped 05/14/22 1838)  vancomycin (VANCOCIN) IVPB 1000 mg/200 mL premix (0 mg Intravenous Stopped 05/14/22 1915)  acetaminophen (TYLENOL) suppository 650 mg (650 mg Rectal Given 05/14/22 1941)  ondansetron (ZOFRAN) injection 4 mg (4 mg Intravenous Given 05/14/22 2020)    Mobility manual wheelchair     Focused Assessments     R Recommendations: See Admitting Provider Note  Report given to:   Additional Notes: Pt came in for gurgling and AMS. She wasn't responding to staff at the nursing facility even though has a hx of dementia. However that's not her baseline. She's being admitted for sepsis due to pneumonia. Her WBC was 11.8, 1st lactic 3.8, and 2nd lactic 4.6. I just sent her 3rd lactic down. So far she's gotten suppository tylenol due to her elevated temp. Her 1st temp was 103.6.  With all the fluids an antibiotics it came down 100.2. She has also gotten Lactated ringers, zofran, cefepime, flagyl and vanc. She threw up earlier so I gave her some zofran.  I'll hang her other antibiotic before she comes up. She's on a purewick but also has a brief on. Her daughter in law is at bedside. After speaking with the doctor they are transitioning her to comfort care.

## 2022-05-14 NOTE — ED Provider Notes (Signed)
[facility] Provider Note   CSN: [id number] Arrival date & time: 05/14/22  1636     History  Chief Complaint  Patient presents with   Altered Mental Status    Beth Pennington is a 82 y.o. female.  The history is provided by the EMS personnel. The history is limited by the condition of the patient. No language interpreter was used.  Altered Mental Status Presenting symptoms: partial responsiveness   Severity:  Severe Most recent episode:  Today Episode history:  Continuous Duration:  2 hours Timing:  Constant Progression:  Unchanged Chronicity:  New Context: dementia and nursing home resident   Associated symptoms: fever   Associated symptoms: no agitation and no vomiting        Home Medications Prior to Admission medications   Medication Sig Start Date End Date Taking? Authorizing Provider  atorvastatin (LIPITOR) 40 MG tablet Take 40 mg by mouth daily. 08/04/21   [provider]  bisacodyl (DULCOLAX) 10 MG suppository Place 1 suppository (10 mg total) rectally daily as needed for moderate constipation. 08/21/21   Debbe Odea, MD  divalproex (DEPAKOTE) 250 MG DR tablet Take 250 mg by mouth 2 (two) times daily. 07/21/21   [provider]  docusate sodium (COLACE) 100 MG capsule Take 1 capsule (100 mg total) by mouth 2 (two) times daily. 08/21/21   Debbe Odea, MD  Eyelid Cleansers (OCUSOFT LID SCRUB ORIGINAL) PADS Apply topically See admin instructions. Apply to both eyes at bedtime    [provider]  FLUoxetine (PROZAC) 10 MG capsule Take 10 mg by mouth daily. 07/13/21   [provider]  FLUoxetine (PROZAC) 10 MG tablet Take 5 mg by mouth at bedtime.    [provider]  haloperidol (HALDOL) 0.5 MG tablet Take 0.5 mg by mouth 2 (two) times daily. 06/16/21   [provider]  levothyroxine (SYNTHROID) 75 MCG tablet Take 75 mcg by mouth daily. 07/29/21   [provider]   losartan (COZAAR) 50 MG tablet Take 50 mg by mouth daily. 07/21/21   [provider]  melatonin 3 MG TABS tablet Take 3 mg by mouth at bedtime.    [provider]  memantine (NAMENDA) 5 MG tablet Take 5 mg by mouth daily. 08/04/21   [provider]  metFORMIN (GLUCOPHAGE) 1000 MG tablet Take 1,000 mg by mouth 2 (two) times daily. 07/18/21   [provider]  OLANZapine (ZYPREXA) 5 MG tablet Take 5 mg by mouth at bedtime. 08/04/21   [provider]  polyethylene glycol (MIRALAX / GLYCOLAX) 17 g packet Take 17 g by mouth daily. 08/22/21   Debbe Odea, MD  potassium chloride SA (KLOR-CON M) 20 MEQ tablet Take 20 mEq by mouth daily. 07/21/21   [provider]  PSYLLIUM PO Take by mouth See admin instructions. 1 capsule qd, changed order 08-11-21 from 0.52gm    [provider]  senna (SENOKOT) 8.6 MG TABS tablet Take 1 tablet (8.6 mg total) by mouth 2 (two) times daily. 08/21/21   Debbe Odea, MD  Skin Protectants, Misc. (BAZA PROTECT EX) See admin instructions. Apply topically to sacral area twice daily for protection    [provider]  vitamin B-12 (CYANOCOBALAMIN) 1000 MCG tablet Take 1,000 mcg by mouth daily.    [provider]  Vitamin D, Ergocalciferol, (DRISDOL) 1.25 MG (50000 UNIT) CAPS capsule Take 50,000 Units by mouth once a week. Patient not taking: Reported on 08/16/2021 07/25/21  [provider]      Allergies    Patient has no known allergies.    Review of Systems   Review of Systems  Unable to perform ROS: Mental status change  Constitutional:  Positive for fever.  HENT:  Positive for congestion.   Respiratory:  Positive for cough.   Gastrointestinal:  Negative for diarrhea and vomiting.  Neurological:  Positive for speech difficulty.  Psychiatric/Behavioral:  Negative for agitation.     Physical Exam Updated Vital Signs BP (!) 154/69 (BP Location: Right Arm)   Pulse (!) 118   Temp (!)  103.6 F (39.8 C) (Rectal)   Resp (!) 32   Ht '5\' 2"'$  (1.575 m)   Wt 52.8 kg   SpO2 98%   BMI 21.29 kg/m  Physical Exam Vitals and nursing note reviewed.  Constitutional:      General: She is in acute distress.     Appearance: She is well-developed. She is ill-appearing.  HENT:     Head: Normocephalic and atraumatic.     Nose: Congestion present.     Mouth/Throat:     Mouth: Mucous membranes are moist.     Pharynx: No oropharyngeal exudate or posterior oropharyngeal erythema.  Eyes:     Conjunctiva/sclera: Conjunctivae normal.     Pupils: Pupils are equal, round, and reactive to light.  Cardiovascular:     Rate and Rhythm: Regular rhythm. Tachycardia present.     Heart sounds: No murmur heard. Pulmonary:     Effort: No respiratory distress.     Breath sounds: No stridor. Rhonchi and rales present.  Chest:     Chest wall: No tenderness.  Abdominal:     Palpations: Abdomen is soft.     Tenderness: There is no abdominal tenderness. There is no guarding or rebound.  Musculoskeletal:        General: No swelling or tenderness.     Cervical back: Neck supple. No tenderness.     Right lower leg: No edema.     Left lower leg: No edema.  Skin:    General: Skin is warm and dry.     Capillary Refill: Capillary refill takes less than 2 seconds.     Findings: No erythema.  Neurological:     GCS: GCS eye subscore is 2. GCS verbal subscore is 2. GCS motor subscore is 6.     Comments: Somnoilent and hard to rouse     ED Results / Procedures / Treatments   Labs (all labs ordered are listed, but only abnormal results are displayed) Labs Reviewed  LACTIC ACID, PLASMA - Abnormal; Notable for the following components:      Result Value   Lactic Acid, Venous 3.8 (*)    All other components within normal limits  LACTIC ACID, PLASMA - Abnormal; Notable for the following components:   Lactic Acid, Venous 4.6 (*)    All other components within normal limits  COMPREHENSIVE METABOLIC  PANEL - Abnormal; Notable for the following components:   Glucose, Bld 205 (*)    BUN 28 (*)    Creatinine, Ser 1.07 (*)    GFR, Estimated 53 (*)    All other components within normal limits  CBC WITH DIFFERENTIAL/PLATELET - Abnormal; Notable for the following components:   WBC 11.8 (*)    Neutro Abs 9.9 (*)    Monocytes Absolute 1.1 (*)    All other components within normal limits  RESP PANEL BY RT-PCR (RSV, FLU A&B, COVID)  RVPGX2  CULTURE, BLOOD (ROUTINE X 2)  CULTURE, BLOOD (ROUTINE X 2)  PROTIME-INR  APTT  TSH  URINALYSIS, W/ REFLEX TO CULTURE (INFECTION SUSPECTED)    EKG None  Radiology CT HEAD WO CONTRAST (5MM)  Result Date: 05/14/2022 CLINICAL DATA:  Mental status change, unknown cause EXAM: CT HEAD WITHOUT CONTRAST TECHNIQUE: Contiguous axial images were obtained from the base of the skull through the vertex without intravenous contrast. RADIATION DOSE REDUCTION: This exam was performed according to the departmental dose-optimization program which includes automated exposure control, adjustment of the mA and/or kV according to patient size and/or use of iterative reconstruction technique. COMPARISON:  None Available. FINDINGS: Brain: No evidence of acute infarction, hemorrhage, hydrocephalus, extra-axial collection or mass lesion/mass effect. Vascular: No hyperdense vessel. Skull: No acute fracture. Sinuses/Orbits: Clear sinuses.  No acute orbital finding. Other: No mastoid effusions. IMPRESSION: No evidence of acute intracranial abnormality. Electronically Signed   By: Margaretha Sheffield M.D.   On: 05/14/2022 19:28   DG Chest Port 1 View  Result Date: 05/14/2022 CLINICAL DATA:  Occlusion or sepsis. EXAM: PORTABLE CHEST 1 VIEW COMPARISON:  Chest radiograph dated 08/15/2021. FINDINGS: Right infrahilar density may represent atelectasis or developing infiltrate. Clinical correlation is recommended. The left lung is clear. No pleural effusion pneumothorax. The cardiac silhouette is  within limits. Atherosclerotic calcification of the aorta. No acute osseous pathology. IMPRESSION: Right infrahilar density may represent atelectasis or developing infiltrate. Electronically Signed   By: Anner Crete M.D.   On: 05/14/2022 17:12    Procedures Procedures    CRITICAL CARE Performed by: Gwenyth Allegra Criselda Starke Total critical care time: 45 minutes Critical care time was exclusive of separately billable procedures and treating other patients. Critical care was necessary to treat or prevent imminent or life-threatening deterioration. Critical care was time spent personally by me on the following activities: development of treatment plan with patient and/or surrogate as well as nursing, discussions with consultants, evaluation of patient's response to treatment, examination of patient, obtaining history from patient or surrogate, ordering and performing treatments and interventions, ordering and review of laboratory studies, ordering and review of radiographic studies, pulse oximetry and re-evaluation of patient's condition.  Medications Ordered in ED Medications  lactated ringers infusion ( Intravenous New Bag/Given 05/14/22 1919)  ceFEPIme (MAXIPIME) 2 g in sodium chloride 0.9 % 100 mL IVPB (has no administration in time range)  vancomycin (VANCOREADY) IVPB 1250 mg/250 mL (has no administration in time range)  ondansetron (ZOFRAN) injection 4 mg (has no administration in time range)  lactated ringers bolus 1,000 mL (0 mLs Intravenous Stopped 05/14/22 1835)    And  lactated ringers bolus 500 mL (0 mLs Intravenous Stopped 05/14/22 1856)    And  lactated ringers bolus 250 mL (0 mLs Intravenous Stopped 05/14/22 1855)  ceFEPIme (MAXIPIME) 2 g in sodium chloride 0.9 % 100 mL IVPB (0 g Intravenous Stopped 05/14/22 1810)  metroNIDAZOLE (FLAGYL) IVPB 500 mg (0 mg Intravenous Stopped 05/14/22 1838)  vancomycin (VANCOCIN) IVPB 1000 mg/200 mL premix (0 mg Intravenous Stopped 05/14/22 1915)   acetaminophen (TYLENOL) suppository 650 mg (650 mg Rectal Given 05/14/22 1941)    ED Course/ Medical Decision Making/ A&P                             Medical Decision Making Amount and/or Complexity of Data Reviewed Labs: ordered. Radiology: ordered. ECG/medicine tests: ordered.  Risk OTC drugs. Prescription drug management. Decision regarding hospitalization.    Beth Coe  Pennington is a 82 y.o. female with a past medical history significant for Alzheimer's dementia, diabetes, hypertension, thyroid disease, and hypercholesterolemia currently lives in a memory care unit at [facility] who presents with concern for sepsis and altered mental status.  According to EMS, patient reportedly was at her baseline but around 2 PM when she went down for nap.  She then was found to be altered and they were concerned for sepsis.  Patient was found to be febrile, tachycardic, tachypneic, and near hypoxic with EMS.  She was having some gurgling breathing they were concerned about aspiration.  Patient is reportedly normally able to communicate and speak but has not been doing as well since they found her.  Glucose was found to be in the 200s.  She was not hypoglycemic.  On arrival, both the paperwork that arrived with her and chart appears to show she is DNR.  On arrival, breath sounds are extremely coarse bilaterally.  Oxygen saturation dipped about 86% on room air so we put her on 5 L and now she is in the upper 90s.  She is very warm to the touch and rectal temp was found to be 103.6.  She is tachypneic and tachycardic.  Blood pressure is not low.  Chest was nontender and abdomen is nontender.  She did respond to painful stimuli in both sides of her body and was able to grip symmetrically bilaterally.  When asked what her name was she did make some sounds but is unclear if this was her name.  As she appears to be both DNR in the chart and the paperwork will hold on intubation at this  time.  I am activating a code sepsis for suspected aspiration pneumonia given the oxygen troubles, auscultation, and the loud breathing sounds with gurgling.  EMS reports they did do some suctioning around the seem to help.  We will get screening labs and code sepsis workup.  Will also get a CT head given the speech difference.  I did briefly speak with neurology who agreed that this sounded to be less likely a code stroke at this time given the other vital signs and description of symptoms.  Will try to get a quick head CT but will continue to treat for suspected sepsis.  Anticipate admission after workup is completed.  CT head showed no acute intracranial abnormality.  X-ray of the chest did show some concern for pneumonia.  COVID/flu/RSV negative.  She does have a leukocytosis and lactic is rising.  Will call for admission for further management.         Final Clinical Impression(s) / ED Diagnoses Final diagnoses:  Sepsis due to pneumonia [facility])     Clinical Impression: 1. Sepsis due to pneumonia [facility])     Disposition: Admit  This note was prepared with assistance of Dragon voice recognition software. Occasional wrong-word or sound-a-like substitutions may have occurred due to the inherent limitations of voice recognition software.     Catheleen Langhorne, Gwenyth Allegra, MD 05/14/22 [phone]

## 2022-05-14 NOTE — H&P (Signed)
Beth Pennington [id number] DOB: Jul 01, 1940 DOA: 05/14/2022     PCP: System, Provider Not In      Patient arrived to ER on 05/14/22 at 1636 Referred by Attending Tegeler, Gwenyth Allegra, *   Patient coming from:     From facility  [facility]   Chief Complaint:   Chief Complaint  Patient presents with   Altered Mental Status    HPI: Beth Pennington is a 82 y.o. female with medical history significant of dementia hyperlipidemia diabetes    Presented with   confusion cough shortness of breath Patient comes from [facility] with confusion when staff went to check on the patient she was making gurgling sounds at her baseline she able to talk.  On arrival pulse 122 satting 91% on room air started on 2 L found to be febrile in ER      Initial COVID TEST  NEGATIVE   Lab Results  Component Value Date   [place] 05/14/2022   [place] NEGATIVE 08/16/2021     Regarding pertinent Chronic problems:     Hyperlipidemia -  on statins Lipitor (atorvastatin)  Lipid Panel  No results found for: "CHOL", "TRIG", "HDL", "CHOLHDL", "VLDL", "LDLCALC", "LDLDIRECT", "LABVLDL"      DM 2 -  Lab Results  Component Value Date   HGBA1C 6.0 (H) 08/16/2021    PO meds only,   Hypothyroidism:  Lab Results  Component Value Date   TSH 1.044 05/14/2022   on synthroid     Dementia - on Depakote    Chronic anemia - baseline hg Hemoglobin & Hematocrit  Recent Labs    08/18/21 0640 08/19/21 0824 05/14/22 1700  HGB 10.7* 9.4* 12.4     While in ER:   Hypoxic in the 80% Febrile Patient appears to be in increased work of breathing Discussed at length with family son is on the way from [place].  At this point would like to lean towards comfort but stable care continue IV antibiotics gentle fluids morphine as needed air hunger low doses and palliative care consult in a.m.     CT HEAD   NON acute  CXR - Right infrahilar density may  represent atelectasis or developing infiltrate.    Following Medications were ordered in ER: Medications  lactated ringers infusion ( Intravenous New Bag/Given 05/14/22 1919)  ceFEPIme (MAXIPIME) 2 g in sodium chloride 0.9 % 100 mL IVPB (has no administration in time range)  vancomycin (VANCOREADY) IVPB 1250 mg/250 mL (has no administration in time range)  lactated ringers bolus 1,000 mL (0 mLs Intravenous Stopped 05/14/22 1835)    And  lactated ringers bolus 500 mL (0 mLs Intravenous Stopped 05/14/22 1856)    And  lactated ringers bolus 250 mL (0 mLs Intravenous Stopped 05/14/22 1855)  ceFEPIme (MAXIPIME) 2 g in sodium chloride 0.9 % 100 mL IVPB (0 g Intravenous Stopped 05/14/22 1810)  metroNIDAZOLE (FLAGYL) IVPB 500 mg (0 mg Intravenous Stopped 05/14/22 1838)  vancomycin (VANCOCIN) IVPB 1000 mg/200 mL premix (0 mg Intravenous Stopped 05/14/22 1915)  acetaminophen (TYLENOL) suppository 650 mg (650 mg Rectal Given 05/14/22 1941)  ondansetron (ZOFRAN) injection 4 mg (4 mg Intravenous Given 05/14/22 2020)     ED Triage Vitals  Enc Vitals Group     BP 05/14/22 1647 (!) 154/69     Pulse Rate 05/14/22 1647 (!) 118     Resp 05/14/22 1647 (!) 32     Temp 05/14/22 1656 (!) 103.6  F (39.8 C)     Temp Source 05/14/22 1656 Rectal     SpO2 05/14/22 1641 91 %     Weight 05/14/22 1649 116 lb 6.5 oz (52.8 kg)     Height 05/14/22 1649 '5\' 2"'$  (1.575 m)     Head Circumference --      Peak Flow --      Pain Score --      Pain Loc --      Pain Edu? --      Excl. in [place]? --   TMAX(24)@     _________________________________________ Significant initial  Findings: Abnormal Labs Reviewed  LACTIC ACID, PLASMA - Abnormal; Notable for the following components:      Result Value   Lactic Acid, Venous 3.8 (*)    All other components within normal limits  LACTIC ACID, PLASMA - Abnormal; Notable for the following components:   Lactic Acid, Venous 4.6 (*)    All other components within normal limits  COMPREHENSIVE  METABOLIC PANEL - Abnormal; Notable for the following components:   Glucose, Bld 205 (*)    BUN 28 (*)    Creatinine, Ser 1.07 (*)    GFR, Estimated 53 (*)    All other components within normal limits  CBC WITH DIFFERENTIAL/PLATELET - Abnormal; Notable for the following components:   WBC 11.8 (*)    Neutro Abs 9.9 (*)    Monocytes Absolute 1.1 (*)    All other components within normal limits      ECG: Ordered Personally reviewed and interpreted by me showing: HR : 120 Rhythm: Sinus tachycardia Low voltage QRS Septal infarct , age undetermined Abnormal ECG QTC 373   ____________________ This patient meets SIRS Criteria and may be septic.    The recent clinical data is shown below. Vitals:   05/14/22 1830 05/14/22 1845 05/14/22 1940 05/14/22 2007  BP: (!) 166/84 (!) 159/87  (!) 161/91  Pulse: (!) 120 (!) 119  (!) 127  Resp:  (!) 30  (!) 37  Temp:   100.2 F (37.9 C)   TempSrc:   Rectal   SpO2: (!) 88% (!) 89%  95%  Weight:      Height:        WBC     Component Value Date/Time   WBC 11.8 (H) 05/14/2022 1700   LYMPHSABS 0.7 05/14/2022 1700   MONOABS 1.1 (H) 05/14/2022 1700   EOSABS 0.0 05/14/2022 1700   BASOSABS 0.1 05/14/2022 1700    Lactic Acid, Venous    Component Value Date/Time   LATICACIDVEN 4.6 (HH) 05/14/2022 1850    Procalcitonin   Ordered      UA   no evidence of UTI      Urine analysis:    Component Value Date/Time   COLORURINE YELLOW 05/14/2022 2107   APPEARANCEUR HAZY (A) 05/14/2022 2107   LABSPEC 1.009 05/14/2022 2107   PHURINE 7.0 05/14/2022 2107   GLUCOSEU >=500 (A) 05/14/2022 2107   HGBUR NEGATIVE 05/14/2022 2107   BILIRUBINUR NEGATIVE 05/14/2022 2107   KETONESUR 5 (A) 05/14/2022 2107   PROTEINUR NEGATIVE 05/14/2022 2107   NITRITE NEGATIVE 05/14/2022 2107   LEUKOCYTESUR NEGATIVE 05/14/2022 2107    Results for orders placed or performed during the hospital encounter of 05/14/22  Resp panel by RT-PCR (RSV, Flu A&B, Covid) Anterior  Nasal Swab     Status: None   Collection Time: 05/14/22  4:51 PM   Specimen: Anterior Nasal Swab  Result Value Ref Range Status  SARS Coronavirus 2 by RT PCR NEGATIVE NEGATIVE Final   Influenza A by PCR NEGATIVE NEGATIVE Final   Influenza B by PCR NEGATIVE NEGATIVE Final         Resp Syncytial Virus by PCR NEGATIVE NEGATIVE Final         ________________________________________ Hospitalist was called for admission for   Sepsis due to pneumonia [facility])    The following Work up has been ordered so far:  Orders Placed This Encounter  Procedures   Resp panel by RT-PCR (RSV, Flu A&B, Covid) Anterior Nasal Swab   Blood Culture (routine x 2)   CT HEAD WO CONTRAST (5MM)   DG Chest Port 1 View   Lactic acid, plasma   Comprehensive metabolic panel   CBC with Differential   Protime-INR   APTT   TSH   Urinalysis, w/ Reflex to Culture (Infection Suspected) -Urine, Clean Catch   Diet NPO time specified   Cardiac monitoring   Document height and weight   Assess and Document Glasgow Coma Scale   Document vital signs within 1-hour of fluid bolus completion. Notify provider of abnormal vital signs despite fluid resuscitation.   DO NOT delay antibiotics if unable to obtain blood culture.   Refer to Sidebar Report: Sepsis Sidebar ED/IP   Notify provider for difficulties obtaining IV access.   Insert peripheral IV x 2   Initiate Carrier Fluid Protocol   Check Rectal Temperature   In and Out Cath   Code Sepsis activation.  This occurs automatically when order is signed and prioritizes pharmacy, lab, and radiology services for STAT collections and interventions.  If [facility] downtime, call [facility] [phone]) to activate Code Sepsis.   ceFEPime (MAXIPIME) per pharmacy consult            vancomycin per pharmacy consult   Consult for [facility] Admission   Pulse oximetry, continuous   ED EKG 12-Lead   EKG 12-Lead     OTHER Significant initial  Findings:  labs showing:    Recent  Labs  Lab 05/14/22 1700  NA 138  K 4.2  CO2 24  GLUCOSE 205*  BUN 28*  CREATININE 1.07*  CALCIUM 9.3    Cr Up from baseline see below Lab Results  Component Value Date   CREATININE 1.07 (H) 05/14/2022   CREATININE 0.89 08/21/2021   CREATININE 1.00 08/16/2021    Recent Labs  Lab 05/14/22 1700  AST 23  ALT 11  ALKPHOS 60  BILITOT 1.0  PROT 6.8  ALBUMIN 3.5   Lab Results  Component Value Date   CALCIUM 9.3 05/14/2022    Plt: Lab Results  Component Value Date   PLT 269 05/14/2022      COVID-19 Labs  No results for input(s): "DDIMER", "FERRITIN", "LDH", "CRP" in the last 72 hours.  Lab Results  Component Value Date   SARSCOV2NAA NEGATIVE 05/14/2022   [place] NEGATIVE 08/16/2021        Recent Labs  Lab 05/14/22 1700  WBC 11.8*  NEUTROABS 9.9*  HGB 12.4  HCT 38.8  MCV 99.0  PLT 269    HG/HCT   stable,      Component Value Date/Time   HGB 12.4 05/14/2022 1700   HCT 38.8 05/14/2022 1700   MCV 99.0 05/14/2022 1700      DM  labs:  HbA1C: Recent Labs    08/16/21 1558  HGBA1C 6.0*       CBG (last 3)  No results for input(s): "GLUCAP" in the last  72 hours.        Cultures: No results found for: "SDES", "SPECREQUEST", "CULT", "REPTSTATUS"   Radiological Exams on Admission: CT HEAD WO CONTRAST (5MM)  Result Date: 05/14/2022 CLINICAL DATA:  Mental status change, unknown cause EXAM: CT HEAD WITHOUT CONTRAST TECHNIQUE: Contiguous axial images were obtained from the base of the skull through the vertex without intravenous contrast. RADIATION DOSE REDUCTION: This exam was performed according to the departmental dose-optimization program which includes automated exposure control, adjustment of the mA and/or kV according to patient size and/or use of iterative reconstruction technique. COMPARISON:  None Available. FINDINGS: Brain: No evidence of acute infarction, hemorrhage, hydrocephalus, extra-axial collection or mass lesion/mass effect.  Vascular: No hyperdense vessel. Skull: No acute fracture. Sinuses/Orbits: Clear sinuses.  No acute orbital finding. Other: No mastoid effusions. IMPRESSION: No evidence of acute intracranial abnormality. Electronically Signed   By: Margaretha Sheffield M.D.   On: 05/14/2022 19:28   DG Chest Port 1 View  Result Date: 05/14/2022 CLINICAL DATA:  Occlusion or sepsis. EXAM: PORTABLE CHEST 1 VIEW COMPARISON:  Chest radiograph dated 08/15/2021. FINDINGS: Right infrahilar density may represent atelectasis or developing infiltrate. Clinical correlation is recommended. The left lung is clear. No pleural effusion pneumothorax. The cardiac silhouette is within limits. Atherosclerotic calcification of the aorta. No acute osseous pathology. IMPRESSION: Right infrahilar density may represent atelectasis or developing infiltrate. Electronically Signed   By: Anner Crete M.D.   On: 05/14/2022 17:12   _______________________________________________________________________________________________________ Latest  Blood pressure (!) 161/91, pulse (!) 127, temperature 100.2 F (37.9 C), temperature source Rectal, resp. rate (!) 37, height '5\' 2"'$  (1.575 m), weight 52.8 kg, SpO2 95 %.   Vitals  labs and radiology finding personally reviewed  Review of Systems:    Pertinent positives include:   fatigue, Fevers  Constitutional:  No weight loss, night sweats,, chills, weight loss  HEENT:  No headaches, Difficulty swallowing,Tooth/dental problems,Sore throat,  No sneezing, itching, ear ache, nasal congestion, post nasal drip,  Cardio-vascular:  No chest pain, Orthopnea, PND, anasarca, dizziness, palpitations.no Bilateral lower extremity swelling  GI:  No heartburn, indigestion, abdominal pain, nausea, vomiting, diarrhea, change in bowel habits, loss of appetite, melena, blood in stool, hematemesis Resp:  no shortness of breath at rest. No dyspnea on exertion, No excess mucus, no productive cough, No non-productive cough,  No coughing up of blood.No change in color of mucus.No wheezing. Skin:  no rash or lesions. No jaundice GU:  no dysuria, change in color of urine, no urgency or frequency. No straining to urinate.  No flank pain.  Musculoskeletal:  No joint pain or no joint swelling. No decreased range of motion. No back pain.  Psych:  No change in mood or affect. No depression or anxiety. No memory loss.  Neuro: no localizing neurological complaints, no tingling, no weakness, no double vision, no gait abnormality, no slurred speech, no confusion  All systems reviewed and apart from [place] all are negative _______________________________________________________________________________________________ Past Medical History:   Past Medical History:  Diagnosis Date   Constipation    Dementia ([place])    Diabetes mellitus without complication ([place])    Hypercholesterolemia    Hypertension    Thyroid disease    Vitamin B12 deficiency       Past Surgical History:  Procedure Laterality Date   ABDOMINAL HYSTERECTOMY     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 Her smoking use included cigarettes. She has been exposed to tobacco smoke. She has never used smokeless tobacco. She reports that she does not currently use alcohol. She reports that she does not currently use drugs.     Family History:   History reviewed. No pertinent family history. ______________________________________________________________________________________________ Allergies: No Known Allergies   Prior to Admission medications   Medication Sig Start Date End Date Taking? Authorizing Provider  aspirin 81 MG chewable tablet Chew 81 mg by mouth 2 (two) times daily with a meal.   Yes [provider]   atorvastatin (LIPITOR) 20 MG tablet Take 20 mg by mouth at bedtime.   Yes [provider]  cyanocobalamin (VITAMIN B12) 1000 MCG tablet Take 1,000 mcg by mouth daily.   Yes [provider]  divalproex (DEPAKOTE SPRINKLE) 125 MG capsule Take 250 mg by mouth 2 (two) times daily.   Yes [provider]  Eyelid Cleansers (OCUSOFT LID SCRUB PLUS) PADS Apply 1 each topically at bedtime. To both eyelids   Yes [provider]  FLUoxetine (PROZAC) 20 MG/5ML solution Take 10 mg by mouth daily.   Yes [provider]  levothyroxine (SYNTHROID) 88 MCG tablet Take 88 mcg by mouth daily before breakfast.   Yes [provider]  losartan (COZAAR) 50 MG tablet Take 50 mg by mouth daily.   Yes [provider]  melatonin 3 MG TABS tablet Take 3 mg by mouth at bedtime.   Yes [provider]  memantine (NAMENDA) 5 MG tablet Take 5 mg by mouth daily.   Yes [provider]  Menthol-Zinc Oxide (CALMOSEPTINE) 0.44-20.6 % OINT Apply 1 application  topically every 8 (eight) hours as needed (irritation). To buttocks   Yes [provider]  METAMUCIL FIBER PO Take 0.4 g by mouth daily.   Yes [provider]  metFORMIN (GLUCOPHAGE) 500 MG tablet Take 500 mg by mouth 2 (two) times daily.   Yes [provider]  metoprolol tartrate (LOPRESSOR) 25 MG tablet Take 12.5 mg by mouth at bedtime.   Yes [provider]  Nutritional Supplements (GLUCERNA PO) Take 1 Bottle by mouth 2 (two) times daily.   Yes [provider]  OLANZapine (ZYPREXA) 5 MG tablet Take 5 mg by mouth at bedtime.   Yes [provider]  polyethylene glycol (MIRALAX / GLYCOLAX) 17 g packet Take 17 g by mouth daily. 08/22/21  Yes Debbe Odea, MD  potassium chloride (MICRO-K) 10 MEQ CR capsule Take 20 mEq by mouth daily.   Yes [provider]  STARCH-MALTO DEXTRIN (THICK-IT) PACK Take 1 each by mouth See admin instructions. Per  MAR, 4 times daily as directed (at 0700, 0800, 1700, 2000)   Yes [provider]  bisacodyl (DULCOLAX) 10 MG suppository Place 1 suppository (10 mg total) rectally daily as needed for moderate constipation. Patient not taking: Reported on 05/14/2022 08/21/21   Debbe Odea, MD  senna (SENOKOT) 8.6 MG TABS tablet Take 1 tablet (8.6 mg total) by mouth 2 (two) times daily. Patient not taking: Reported on 05/14/2022 08/21/21   Debbe Odea, MD    ___________________________________________________________________________________________________ Physical Exam:    05/14/2022    8:07 PM 05/14/2022    6:45 PM 05/14/2022    6:30 PM  Vitals with BMI  Systolic [id number] Q000111Q [id number]  Diastolic 91 87 84  Pulse [id number] 119 120  1. General:  in Acute distress increased work of breathing    Chronically ill   -appearing 2. Psychological: Somnolent, eyes are closed 3. Head/ENT:    Dry Mucous Membranes                          Head Non traumatic, neck supple                          Poor Dentition 4. SKIN decreased Skin turgor,  Skin clean Dry and intact no rash 5. Heart: Regular rate and rhythm no  Murmur, no Rub or gallop 6. Lungs: Some crackles noted loud breath sounds 7. Abdomen: Soft,  non-tender, Non distended  bowel sounds present 8. Lower extremities: no clubbing, cyanosis, no  edema 9. Neurologically Grossly intact, moving all 4 extremities equally   10. MSK: Normal range of motion    Chart has been reviewed  ______________________________________________________________________________________________  Assessment/Plan   82 y.o. female with medical history significant of dementia hyperlipidemia diabetes    Admitted for   Sepsis due to pneumonia [facility])     Present on Admission:  Dementia without behavioral disturbance ([place])  Sepsis ([place])  Severe sepsis ([place])  Aspiration pneumonia ([place])  Acute metabolic encephalopathy  Acute respiratory failure with hypoxia ([place])     DM (diabetes  mellitus), type 2 ([place]) ORDER SLIDING SCALE Hold metformin  Dementia without behavioral disturbance ([place]) Chronic monitor for sundowning  Sepsis ([place])  -SIRS criteria met with elevated white blood cell count,       Component Value Date/Time   WBC 11.8 (H) 05/14/2022 1700   LYMPHSABS 0.7 05/14/2022 1700     tachycardia   ,   Fever  RR >20 Today's Vitals   05/14/22 1830 05/14/22 1845 05/14/22 1940 05/14/22 2007  BP: (!) 166/84 (!) 159/87  (!) 161/91  Pulse: (!) 120 (!) 119  (!) 127  Resp:  (!) 30  (!) 37  Temp:   100.2 F (37.9 C)   TempSrc:   Rectal   SpO2: (!) 88% (!) 89%  95%  Weight:      Height:       Body mass index is 21.29 kg/m.     The recent clinical data is shown below. Vitals:   05/14/22 1830 05/14/22 1845 05/14/22 1940 05/14/22 2007  BP: (!) 166/84 (!) 159/87  (!) 161/91  Pulse: (!) 120 (!) 119  (!) 127  Resp:  (!) 30  (!) 37  Temp:   100.2 F (37.9 C)   TempSrc:   Rectal   SpO2: (!) 88% (!) 89%  95%  Weight:      Height:         -Most likely source being: , pulmonary,    Patient meeting criteria for Severe sepsis with    evidence of end organ damage/organ dysfunction such as    elevated lactic acid >2     Component Value Date/Time   LATICACIDVEN 4.6 (HH) 05/14/2022 [id number]    acute metabolic encephalopathy  [id number] mmhg or MAP < 65 mmhg,   Acute hypoxia requiring new supplemental oxygen, SpO2: 95 % O2 Flow Rate (L/min): 5 L/min     *Patient is meeting criteria for SEPTIC SHOCK with  lactic acid > 4     - Obtain serial lactic acid and procalcitonin level.  - Initiated IV antibiotics in ER: Antibiotics Given (last 72 hours)     Date/Time Action  Medication Dose Rate   05/14/22 1726 New Bag/Given   ceFEPIme (MAXIPIME) 2 g in sodium chloride 0.9 % 100 mL IVPB 2 g 200 mL/hr   05/14/22 1741 New Bag/Given   metroNIDAZOLE (FLAGYL) IVPB 500 mg 500 mg 100 mL/hr   05/14/22 1812 New Bag/Given   vancomycin (VANCOCIN) IVPB 1000 mg/200 mL premix 1,000  mg 200 mL/hr       Will continue  on :    - await results of blood and urine culture  - Rehydrate aggressively  Intravenous fluids were administered,   9:27 PM   Aspiration pneumonia (HCC) Treat with Unasyn  Discussed with family at this point leaning much more closer to comfort care.  Okay to provide comfort foods as needed  Acute metabolic encephalopathy   - most likely multifactorial secondary to combination of infection  mild dehydration secondary to decreased by mouth intake,  polypharmacy   - Will rehydrate   - treat underlining infection   - Hold contributing medications   - if no improvement may need further imaging to evaluate for CNS pathology pathology such as MRI of the brain   - neurological exam appears to be nonfocal but patient unable to cooperate full  Patient is not transitioning to comfort   Acute respiratory failure with hypoxia ([place])  this patient has acute respiratory failure with Hypoxia as documented by the presence of following: O2 saturatio< 90% on RA Likely due to:   Pneumonia  Provide O2 therapy and titrate as needed  Continuous pulse ox Family is transitioning to comfort continue to provide oxygen by facemask for support. Low-dose morphine as needed air hunger     Other plan as per orders.  DVT prophylaxis:  SCD      Code Status:   DNR/DNI    as per family  I had personally discussed CODE STATUS with  family  ACP reviewed  Family Communication:   Family  at  Bedside  plan of care was discussed   with  , Daughter in law,    Disposition Plan:                            Back to current facility when stable                 Following barriers for discharge:                            Electrolytes corrected                                                     Afebrile, white count improving able to transition to PO antibiotics                             Will need to be able to tolerate PO                                          Would  benefit from PT/OT eval prior to DC  Ordered  Swallow eval - SLP ordered                   Diabetes care coordinator                   Transition of care consulted                   Nutrition    consulted                                    Palliative care    consulted                      Consults called: none    Admission status:  ED Disposition     ED Disposition  Admit   Condition  --   [place]: [place] [[id number]]  Level of Care: Progressive [102]  Admit to Progressive based on following criteria: MULTISYSTEM THREATS such as stable sepsis, metabolic/electrolyte imbalance with or without encephalopathy that is responding to early treatment.  May admit patient to [facility] or [facility] if equivalent level of care is available:: No  Covid Evaluation: Confirmed COVID Negative  Diagnosis: [id number]  Admitting Physician: Toy Baker [[id number]  Attending Physician: Toy Baker [id number]  Certification:: I certify this patient will need inpatient services for at least 2 midnights  Estimated Length of Stay: 2          inpatient     I Expect 2 midnight stay secondary to severity of patient's current illness need for inpatient interventions justified by the following:  hemodynamic instability despite optimal treatment (tachycardia   hypoxia, )  Severe lab/radiological/exam abnormalities including:    CAP and sepsis and extensive comorbidities including:  dementia    That are currently affecting medical management.   I expect  patient to be hospitalized for 2 midnights requiring inpatient medical care.  Patient is at high risk for adverse outcome (such as loss of life or disability) if not treated.  Indication for inpatient stay as follows:    Hemodynamic instability despite maximal medical therapy,    New or worsening hypoxia   Need for IV antibiotics, IV fluids,      Level of care       progressive tele indefinitely please discontinue once patient no longer qualifies COVID-19 Labs    Lab Results  Component Value Date   [place] 05/14/2022     Precautions: admitted as   Covid Negative     Catera Hankins 05/14/2022, 10:58 PM    [facility]     after 2 AM please page floor coverage PA If 7AM-7PM, please contact the day team taking care of the patient using [email or web address]   Patient was evaluated in the context of the global COVID-19 pandemic, which necessitated consideration that the patient might be at risk for infection with the SARS-CoV-2 virus that causes COVID-19. Institutional protocols and algorithms that pertain to the evaluation of patients at risk for COVID-19 are in a state of rapid change based on information released by regulatory bodies including the CDC and federal and state organizations. These policies and algorithms were followed during the patient's care.

## 2022-05-14 NOTE — ED Notes (Signed)
Pt gone to CT 

## 2022-05-15 ENCOUNTER — Other Ambulatory Visit: Payer: Self-pay

## 2022-05-15 DIAGNOSIS — J9601 Acute respiratory failure with hypoxia: Secondary | ICD-10-CM | POA: Diagnosis not present

## 2022-05-15 DIAGNOSIS — F0390 Unspecified dementia without behavioral disturbance: Secondary | ICD-10-CM | POA: Diagnosis not present

## 2022-05-15 DIAGNOSIS — Z66 Do not resuscitate: Secondary | ICD-10-CM | POA: Diagnosis not present

## 2022-05-15 DIAGNOSIS — I959 Hypotension, unspecified: Secondary | ICD-10-CM | POA: Insufficient documentation

## 2022-05-15 DIAGNOSIS — E872 Acidosis, unspecified: Secondary | ICD-10-CM | POA: Diagnosis present

## 2022-05-15 DIAGNOSIS — I10 Essential (primary) hypertension: Secondary | ICD-10-CM

## 2022-05-15 DIAGNOSIS — L89301 Pressure ulcer of unspecified buttock, stage 1: Secondary | ICD-10-CM

## 2022-05-15 DIAGNOSIS — G9341 Metabolic encephalopathy: Secondary | ICD-10-CM | POA: Diagnosis not present

## 2022-05-15 DIAGNOSIS — J690 Pneumonitis due to inhalation of food and vomit: Secondary | ICD-10-CM

## 2022-05-15 DIAGNOSIS — L8990 Pressure ulcer of unspecified site, unspecified stage: Secondary | ICD-10-CM | POA: Diagnosis present

## 2022-05-15 DIAGNOSIS — E039 Hypothyroidism, unspecified: Secondary | ICD-10-CM

## 2022-05-15 DIAGNOSIS — Z515 Encounter for palliative care: Secondary | ICD-10-CM

## 2022-05-15 DIAGNOSIS — A419 Sepsis, unspecified organism: Secondary | ICD-10-CM | POA: Diagnosis not present

## 2022-05-15 LAB — CBC
HCT: 31.5 % — ABNORMAL LOW (ref 36.0–46.0)
Hemoglobin: 10.3 g/dL — ABNORMAL LOW (ref 12.0–15.0)
MCH: 32.2 pg (ref 26.0–34.0)
MCHC: 32.7 g/dL (ref 30.0–36.0)
MCV: 98.4 fL (ref 80.0–100.0)
Platelets: 215 10*3/uL (ref 150–400)
RBC: 3.2 MIL/uL — ABNORMAL LOW (ref 3.87–5.11)
RDW: 13.8 % (ref 11.5–15.5)
WBC: 9.3 10*3/uL (ref 4.0–10.5)
nRBC: 0 % (ref 0.0–0.2)

## 2022-05-15 LAB — LACTIC ACID, PLASMA
Lactic Acid, Venous: 5.1 mmol/L (ref 0.5–1.9)
Lactic Acid, Venous: 6.3 mmol/L (ref 0.5–1.9)

## 2022-05-15 LAB — COMPREHENSIVE METABOLIC PANEL
ALT: 9 U/L (ref 0–44)
AST: 25 U/L (ref 15–41)
Albumin: 2.5 g/dL — ABNORMAL LOW (ref 3.5–5.0)
Alkaline Phosphatase: 39 U/L (ref 38–126)
Anion gap: 12 (ref 5–15)
BUN: 28 mg/dL — ABNORMAL HIGH (ref 8–23)
CO2: 23 mmol/L (ref 22–32)
Calcium: 8.4 mg/dL — ABNORMAL LOW (ref 8.9–10.3)
Chloride: 104 mmol/L (ref 98–111)
Creatinine, Ser: 1.27 mg/dL — ABNORMAL HIGH (ref 0.44–1.00)
GFR, Estimated: 43 mL/min — ABNORMAL LOW (ref >60)
Glucose, Bld: 100 mg/dL — ABNORMAL HIGH (ref 70–99)
Potassium: 4.3 mmol/L (ref 3.5–5.1)
Sodium: 139 mmol/L (ref 135–145)
Total Bilirubin: 0.8 mg/dL (ref 0.3–1.2)
Total Protein: 5.3 g/dL — ABNORMAL LOW (ref 6.5–8.1)

## 2022-05-15 LAB — OSMOLALITY: Osmolality: 297 mOsm/kg — ABNORMAL HIGH (ref 275–295)

## 2022-05-15 LAB — PHOSPHORUS: Phosphorus: 3.8 mg/dL (ref 2.5–4.6)

## 2022-05-15 LAB — GLUCOSE, CAPILLARY
Glucose-Capillary: 139 mg/dL — ABNORMAL HIGH (ref 70–99)
Glucose-Capillary: 153 mg/dL — ABNORMAL HIGH (ref 70–99)
Glucose-Capillary: 86 mg/dL (ref 70–99)

## 2022-05-15 LAB — PROCALCITONIN: Procalcitonin: 3.76 ng/mL

## 2022-05-15 LAB — PREALBUMIN: Prealbumin: 19 mg/dL (ref 18–38)

## 2022-05-15 LAB — MAGNESIUM: Magnesium: 1.5 mg/dL — ABNORMAL LOW (ref 1.7–2.4)

## 2022-05-15 MED ORDER — HYDROCODONE-ACETAMINOPHEN 5-325 MG PO TABS: 1.0000 | ORAL_TABLET | ORAL | Status: DC | PRN

## 2022-05-15 MED ORDER — ACETAMINOPHEN 325 MG PO TABS: 650.0000 mg | ORAL_TABLET | Freq: Four times a day (QID) | ORAL | Status: DC | PRN

## 2022-05-15 MED ORDER — SODIUM CHLORIDE 0.9 % IV SOLN: INTRAVENOUS | Status: DC

## 2022-05-15 MED ORDER — ACETAMINOPHEN 650 MG RE SUPP: 650.0000 mg | Freq: Four times a day (QID) | RECTAL | Status: DC | PRN

## 2022-05-15 NOTE — Consult Note (Signed)
Consultation Note Date: 05/15/2022   Patient Name: Beth Pennington  DOB: Jan 22, 1941  MRN: [id number]  Age / Sex: 82 y.o., female  PCP: System, Provider Not In Referring Physician: Mercy Riding, MD  Reason for Consultation:  GOC   HPI/Patient Profile:   82 year old F with PMH of severe dementia, DM-2, HTN, HLD, hypothyroidism and GERD presenting with confusion, cough and shortness of breath, and found to be in septic shock likely from possible aspiration pneumonia.  She was admitted for end-of-life care,  but with continuation of antibiotics, supportive measures hoping for son to have the opportunity to see his mother.   Patient's son is flying in from [place].     Clinical Assessment and Goals of Care:  This NP Wadie Lessen reviewed medical records, received report from team, assessed the patient and then spoke to daughter in [place] by phone    to discuss  [place], EOL wishes disposition and options.   Concept of Palliative Care was introduced as specialized medical care for people and their families living with serious illness.  If focuses on providing relief from the symptoms and stress of a serious illness.  The goal is to improve quality of life for both the patient and the family.  [name] verbalizes understanding that patient's prognosis is limited, her main hope is that patient's son will arrive to bedside before death.  At this time desire is to continue all supportive measures. When son arrives to bedside tonight around midnight, is  the hope,  then further decision regarding shift in level of care will be made at that time.  Education offered on the  difference between a aggressive medical intervention path  and a palliative comfort care path for this patient at this time was had.    Values and goals of care important to patient and family were attempted to be elicited.   Natural  trajectory and expectations at EOL were discussed.    I prepared family that anything could happen at any time--prognosis is likely hours to days  Questions and concerns addressed.  Family  encouraged to call with questions or concerns.     PMT will continue to support holistically.   Wendall Mola Cottier/son is HPOA       Primary Diagnoses: Present on Admission:  Dementia without behavioral disturbance (HCC)  Sepsis ([place])  Severe sepsis ([place])  Aspiration pneumonia ([place])  Acute metabolic encephalopathy  Acute respiratory failure with hypoxia ([place])   I have reviewed the medical record, interviewed the patient and family, and examined the patient. The following aspects are pertinent.  Past Medical History:  Diagnosis Date   Constipation    Dementia ([place])    Diabetes mellitus without complication ([place])    Hypercholesterolemia    Hypertension    Thyroid disease    Vitamin B12 deficiency    Social History   Socioeconomic History   Marital status: Widowed    Spouse name: Not on file   Number of children: Not on file   Years  of education: Not on file   Highest education level: Not on file  Occupational History   Not on file  Tobacco Use   Smoking status: Former    Types: Cigarettes    Passive exposure: Past   Smokeless tobacco: Never  Vaping Use   Vaping Use: Never used  Substance and Sexual Activity   Alcohol use: Not Currently   Drug use: Not Currently   Sexual activity: Not Currently  Other Topics Concern   Not on file  Social History Narrative   Not on file   Social Determinants of Health   Financial Resource Strain: Not on file  Food Insecurity: Not on file  Transportation Needs: Not on file  Physical Activity: Not on file  Stress: Not on file  Social Connections: Not on file   History reviewed. No pertinent family history. Scheduled Meds:  insulin aspart  0-9 Units Subcutaneous Q4H   Continuous Infusions:  sodium chloride 50 mL/hr at 05/15/22  0700   ampicillin-sulbactam (UNASYN) IV Stopped (05/15/22 0415)   PRN Meds:.acetaminophen **OR** acetaminophen, albuterol, antiseptic oral rinse, glycopyrrolate **OR** glycopyrrolate **OR** glycopyrrolate, haloperidol **OR** haloperidol **OR** haloperidol lactate, morphine injection, ondansetron **OR** ondansetron (ZOFRAN) IV, polyvinyl alcohol Medications Prior to Admission:  Prior to Admission medications   Medication Sig Start Date End Date Taking? Authorizing Provider  aspirin 81 MG chewable tablet Chew 81 mg by mouth 2 (two) times daily with a meal.   Yes [provider]  atorvastatin (LIPITOR) 20 MG tablet Take 20 mg by mouth at bedtime.   Yes [provider]  cyanocobalamin (VITAMIN B12) 1000 MCG tablet Take 1,000 mcg by mouth daily.   Yes [provider]  divalproex (DEPAKOTE SPRINKLE) 125 MG capsule Take 250 mg by mouth 2 (two) times daily.   Yes [provider]  Eyelid Cleansers (OCUSOFT LID SCRUB PLUS) PADS Apply 1 each topically at bedtime. To both eyelids   Yes [provider]  FLUoxetine (PROZAC) 20 MG/5ML solution Take 10 mg by mouth daily.   Yes [provider]  levothyroxine (SYNTHROID) 88 MCG tablet Take 88 mcg by mouth daily before breakfast.   Yes [provider]  losartan (COZAAR) 50 MG tablet Take 50 mg by mouth daily.   Yes [provider]  melatonin 3 MG TABS tablet Take 3 mg by mouth at bedtime.   Yes [provider]  memantine (NAMENDA) 5 MG tablet Take 5 mg by mouth daily.   Yes [provider]  Menthol-Zinc Oxide (CALMOSEPTINE) 0.44-20.6 % OINT Apply 1 application  topically every 8 (eight) hours as needed (irritation). To buttocks   Yes [provider]  METAMUCIL FIBER PO Take 0.4 g by mouth daily.   Yes [provider]  metFORMIN (GLUCOPHAGE) 500 MG tablet Take 500 mg by mouth 2 (two) times daily.   Yes [provider]  metoprolol tartrate (LOPRESSOR)  25 MG tablet Take 12.5 mg by mouth at bedtime.   Yes [provider]  Nutritional Supplements (GLUCERNA PO) Take 1 Bottle by mouth 2 (two) times daily.   Yes [provider]  OLANZapine (ZYPREXA) 5 MG tablet Take 5 mg by mouth at bedtime.   Yes [provider]  polyethylene glycol (MIRALAX / GLYCOLAX) 17 g packet Take 17 g by mouth daily. 08/22/21  Yes Debbe Odea, MD  potassium chloride (MICRO-K) 10 MEQ CR capsule Take 20 mEq by mouth daily.   Yes [provider]  STARCH-MALTO DEXTRIN (THICK-IT) PACK Take 1 each  by mouth See admin instructions. Per MAR, 4 times daily as directed (at 0700, 0800, 1700, 2000)   Yes [provider]  bisacodyl (DULCOLAX) 10 MG suppository Place 1 suppository (10 mg total) rectally daily as needed for moderate constipation. Patient not taking: Reported on 05/14/2022 08/21/21   Debbe Odea, MD  senna (SENOKOT) 8.6 MG TABS tablet Take 1 tablet (8.6 mg total) by mouth 2 (two) times daily. Patient not taking: Reported on 05/14/2022 08/21/21   Debbe Odea, MD   No Known Allergies Review of Systems  Unable to perform ROS: Acuity of condition    Physical Exam Constitutional:      Appearance: She is underweight. She is ill-appearing.     Interventions: Face mask in place.  Skin:    General: Skin is warm and dry.  Neurological:     Mental Status: She is unresponsive.     Vital Signs: BP (!) 105/53 (BP Location: Left Arm)   Pulse (!) 110   Temp 99.9 F (37.7 C) (Axillary)   Resp (!) 22   Ht '5\' 2"'$  (1.575 m)   Wt 52.8 kg   SpO2 100%   BMI 21.29 kg/m  Pain Scale: Faces   Pain Score: Asleep (given for temp)   SpO2: SpO2: 100 % O2 Device:SpO2: 100 % O2 Flow Rate: .O2 Flow Rate (L/min): 5 L/min  IO: Intake/output summary:  Intake/Output Summary (Last 24 hours) at 05/15/2022 0809 Last data filed at 05/15/2022 0700 Gross per 24 hour  Intake 3086.47 ml  Output 150 ml  Net 2936.47 ml    LBM:   Baseline Weight:  Weight: 52.8 kg Most recent weight: Weight: 52.8 kg     Palliative Assessment/Data: 20 % at best    75 minutes  Discussed above with bedside RN   Signed by: Wadie Lessen, NP   Please contact Palliative Medicine Team phone at [phone] for questions and concerns.  For individual provider: See Shea Evans

## 2022-05-15 NOTE — Progress Notes (Signed)
SLP Cancellation Note  Patient Details Name: Beth Pennington MRN: [id number] DOB: November 14, 1940   Cancelled evaluation: Pt not  sufficiently alert to participate in swallowing assessment. Will follow along for readiness and necessity.  Beth Pennington L. Tivis Ringer, MA CCC/SLP Clinical Specialist - Acute Care SLP Acute Rehabilitation Services Office number [phone]         Beth Pennington 05/15/2022, 9:40 AM

## 2022-05-15 NOTE — Progress Notes (Signed)
PROGRESS NOTE  Beth Pennington [id number] DOB: October 28, 1940   PCP: System, Provider Not In  Patient is from: [facility]    [place]: 05/14/2022 LOS: 1  Chief complaints Chief Complaint  Patient presents with   Altered Mental Status     Brief Narrative / Interim history: 82 year old F with PMH of severe dementia, DM-2, HTN, HLD, hypothyroidism and GERD presenting with confusion, cough and shortness of breath, and found to be in septic shock likely from possible aspiration pneumonia.  She was admitted for end-of-life care (full comfort care) but with continuation of antibiotics until further goals of care discussion.  Patient's son is flying in from [place].  Palliative medicine consulted.   Subjective: Seen and examined earlier this morning and this afternoon.  Patient was very somnolent and not arousable earlier in the morning.  She was on nonrebreather.  She was awake later but difficult to understand his speech.  Patient's cousin at bedside  Objective: Vitals:   05/15/22 0700 05/15/22 0752 05/15/22 0800 05/15/22 0900  BP:  (!) 105/53    Pulse: (!) 110 (!) 110 (!) 116 (!) 106  Resp: (!) 22 (!) 22 (!) 22 (!) 22  Temp:  99.9 F (37.7 C)    TempSrc:  Axillary    SpO2: 100% 100% 100% 100%  Weight:      Height:        Examination:  GENERAL: No apparent distress.  Nontoxic. HEENT: Dry mucosal membrane.  Vision and hearing grossly intact.  NECK: Supple.  No apparent JVD.  RESP:  No IWOB.  Fair aeration bilaterally.  Seems to be gurgling when she breathes. CVS:  RRR. Heart sounds normal.  ABD/GI/GU: BS+. Abd soft, NTND.  MSK/EXT:  Moves extremities. No apparent deformity. No edema.  SKIN: no apparent skin lesion or wound NEURO: Awake but not oriented.  Does not follow command.  No apparent focal neuro deficit. PSYCH: Calm. Normal affect.   Procedures:  None  Microbiology summarized: [id number], influenza and RSV PCR nonreactive Blood cultures  NGTD.  Assessment and plan: Principal Problem:   Severe sepsis with septic shock (HCC) Active Problems:   Diabetes mellitus, type 2 (HCC)   Dementia without behavioral disturbance (HCC)   Aspiration pneumonia (HCC)   Acute metabolic encephalopathy   Acute respiratory failure with hypoxia (HCC)   Pressure injury of skin   Lactic acidosis   End of life care   Esophageal reflux   Hypertension   Hypothyroidism   Severe sepsis with septic shock likely due to aspiration pneumonia: POA.  Tachycardia, tachypnea, fever, leukocytosis, respiratory failure, encephalopathy and lactic acidosis.  CXR showed right infrahilar density.  Procalcitonin elevated.  Urinalysis without significant finding.  Blood culture NGTD.  Given his severe dementia and poor prognosis, patient was admitted for comfort care but with continuation of IV antibiotics until further goals of care discussion.  Patient's son is flying in from [place] and will be here tonight.  Palliative medicine following. -Continue IV Unasyn  Acute respiratory failure with hypoxia: Desaturated to upper 80s on 5 L and started on nonrebreather.  Seems to have improved. -Supplemental oxygen as needed -IV antibiotics as above -Continue as needed meds for symptom management  Acute metabolic encephalopathy: Likely due to #1. Dementia without behavioral disturbance -Symptomatic management with emphasis on comfort  End-of-life care/full comfort care/DNR/DNI -Continue current comfort meds -Palliative medicine following.  Hypotension/history of hypertension: Remains hypotensive -Continue holding home antihypertensive meds.  Hypothyroidism-continue holding Synthroid  Lactic acidosis-no further lab  draws.  Hypomagnesemia  Body mass index is 21.29 kg/m.  Pressure skin injury: POA Pressure Injury 05/14/22 Coccyx Mid Stage 1 -  Intact skin with non-blanchable redness of a localized area usually over a bony prominence. (Active)  05/14/22 2345   Location: Coccyx  Location Orientation: Mid  Staging: Stage 1 -  Intact skin with non-blanchable redness of a localized area usually over a bony prominence.  Wound Description (Comments):   Present on Admission: Yes  Dressing Type None 05/14/22 2345   DVT prophylaxis:  Patient is comfort care.  Code Status: DNR/DNI Family Communication: Updated patient's cousin at bedside.  Daughter-in-law at work.  Son flying in from [place]. Level of care: Progressive Status is: Inpatient Remains inpatient appropriate because: End-of-life care   Final disposition: TBD Consultants:  Palliative medicine  55 minutes with more than 50% spent in reviewing records, counseling patient/family and coordinating care.   Sch Meds:  Scheduled Meds:  insulin aspart  0-9 Units Subcutaneous Q4H   Continuous Infusions:  sodium chloride 50 mL/hr at 05/15/22 0900   ampicillin-sulbactam (UNASYN) IV 3 g (05/15/22 0914)   PRN Meds:.acetaminophen **OR** acetaminophen, albuterol, antiseptic oral rinse, glycopyrrolate **OR** glycopyrrolate **OR** glycopyrrolate, haloperidol **OR** haloperidol **OR** haloperidol lactate, morphine injection, ondansetron **OR** ondansetron (ZOFRAN) IV, polyvinyl alcohol  Antimicrobials: Anti-infectives (From admission, onward)    Start     Dose/Rate Route Frequency Ordered Stop   05/16/22 0000  vancomycin (VANCOREADY) IVPB 1250 mg/250 mL  Status:  Discontinued        1,250 mg 166.7 mL/hr over 90 Minutes Intravenous Every 48 hours 05/14/22 1932 05/14/22 2153   05/15/22 0600  ceFEPIme (MAXIPIME) 2 g in sodium chloride 0.9 % 100 mL IVPB  Status:  Discontinued        2 g 200 mL/hr over 30 Minutes Intravenous Every 12 hours 05/14/22 1932 05/14/22 2153   05/14/22 2200  Ampicillin-Sulbactam (UNASYN) 3 g in sodium chloride 0.9 % 100 mL IVPB        3 g 200 mL/hr over 30 Minutes Intravenous Every 6 hours 05/14/22 2153     05/14/22 1700  ceFEPIme (MAXIPIME) 2 g in sodium chloride 0.9 %  100 mL IVPB        2 g 200 mL/hr over 30 Minutes Intravenous  Once 05/14/22 1652 05/14/22 1810   05/14/22 1700  metroNIDAZOLE (FLAGYL) IVPB 500 mg        500 mg 100 mL/hr over 60 Minutes Intravenous  Once 05/14/22 1652 05/14/22 1838   05/14/22 1700  vancomycin (VANCOCIN) IVPB 1000 mg/200 mL premix        1,000 mg 200 mL/hr over 60 Minutes Intravenous  Once 05/14/22 1652 05/14/22 1915        I have personally reviewed the following labs and images: CBC: Recent Labs  Lab 05/14/22 1700 05/14/22 2219  WBC 11.8*  --   NEUTROABS 9.9*  --   HGB 12.4 11.6*  HCT 38.8 34.0*  MCV 99.0  --   PLT 269  --    BMP &GFR Recent Labs  Lab 05/14/22 1700 05/14/22 2207 05/14/22 2219  NA 138  --  138  K 4.2  --  4.1  CL 101  --   --   CO2 24  --   --   GLUCOSE 205*  --   --   BUN 28*  --   --   CREATININE 1.07*  --   --   CALCIUM 9.3  --   --  MG  --  1.3*  --   PHOS  --  3.1  --    Estimated Creatinine Clearance: 33.2 mL/min (A) (by C-G formula based on SCr of 1.07 mg/dL (H)). Liver & Pancreas: Recent Labs  Lab 05/14/22 1700  AST 23  ALT 11  ALKPHOS 60  BILITOT 1.0  PROT 6.8  ALBUMIN 3.5   No results for input(s): "LIPASE", "AMYLASE" in the last 168 hours. No results for input(s): "AMMONIA" in the last 168 hours. Diabetic: No results for input(s): "HGBA1C" in the last 72 hours. Recent Labs  Lab 05/14/22 2203 05/15/22 0331 05/15/22 0755  GLUCAP 207* 139* 153*   Cardiac Enzymes: Recent Labs  Lab 05/14/22 2207  CKTOTAL 39   No results for input(s): "PROBNP" in the last 8760 hours. Coagulation Profile: Recent Labs  Lab 05/14/22 1700  INR 1.1   Thyroid Function Tests: Recent Labs    05/14/22 1700  TSH 1.044   Lipid Profile: No results for input(s): "CHOL", "HDL", "LDLCALC", "TRIG", "CHOLHDL", "LDLDIRECT" in the last 72 hours. Anemia Panel: No results for input(s): "VITAMINB12", "FOLATE", "FERRITIN", "TIBC", "IRON", "RETICCTPCT" in the last 72  hours. Urine analysis:    Component Value Date/Time   COLORURINE YELLOW 05/14/2022 2107   APPEARANCEUR HAZY (A) 05/14/2022 2107   LABSPEC 1.009 05/14/2022 2107   PHURINE 7.0 05/14/2022 2107   GLUCOSEU >=500 (A) 05/14/2022 2107   HGBUR NEGATIVE 05/14/2022 2107   BILIRUBINUR NEGATIVE 05/14/2022 2107   KETONESUR 5 (A) 05/14/2022 2107   PROTEINUR NEGATIVE 05/14/2022 2107   NITRITE NEGATIVE 05/14/2022 2107   LEUKOCYTESUR NEGATIVE 05/14/2022 2107   Sepsis Labs: Invalid input(s): "PROCALCITONIN", "LACTICIDVEN"  Microbiology: Recent Results (from the past 240 hour(s))  Resp panel by RT-PCR (RSV, Flu A&B, Covid) Anterior Nasal Swab     Status: None   Collection Time: 05/14/22  4:51 PM   Specimen: Anterior Nasal Swab  Result Value Ref Range Status   SARS Coronavirus 2 by RT PCR NEGATIVE NEGATIVE Final   Influenza A by PCR NEGATIVE NEGATIVE Final   Influenza B by PCR NEGATIVE NEGATIVE Final    Comment: (NOTE) The Xpert Xpress SARS-CoV-2/FLU/RSV plus assay is intended as an aid in the diagnosis of influenza from Nasopharyngeal swab specimens and should not be used as a sole basis for treatment. Nasal washings and aspirates are unacceptable for Xpert Xpress SARS-CoV-2/FLU/RSV testing.  Fact Sheet for Patients: EntrepreneurPulse.com.au  Fact Sheet for Healthcare Providers: IncredibleEmployment.be  This test is not yet approved or cleared by the [place] FDA and has been authorized for detection and/or diagnosis of SARS-CoV-2 by FDA under an Emergency Use Authorization (EUA). This EUA will remain in effect (meaning this test can be used) for the duration of the COVID-19 declaration under Section 564(b)(1) of the Act, 21 U.S.C. section 360bbb-3(b)(1), unless the authorization is terminated or revoked.     Resp Syncytial Virus by PCR NEGATIVE NEGATIVE Final    Comment: (NOTE) Fact Sheet for  Patients: EntrepreneurPulse.com.au  Fact Sheet for Healthcare Providers: IncredibleEmployment.be  This test is not yet approved or cleared by the [place] FDA and has been authorized for detection and/or diagnosis of SARS-CoV-2 by FDA under an Emergency Use Authorization (EUA). This EUA will remain in effect (meaning this test can be used) for the duration of the COVID-19 declaration under Section 564(b)(1) of the Act, 21 U.S.C. section 360bbb-3(b)(1), unless the authorization is terminated or revoked.  Performed at [facility], [place] [address] [place], [place] [address]  Blood Culture (routine x 2)     Status: None (Preliminary result)   Collection Time: 05/14/22  4:58 PM   Specimen: BLOOD  Result Value Ref Range Status   Specimen Description BLOOD SITE NOT SPECIFIED  Final   Special Requests   Final    BOTTLES DRAWN AEROBIC AND ANAEROBIC Blood Culture adequate volume   Culture   Final    NO GROWTH < 24 HOURS Performed at [facility], [place] [address], [place], [place] [address]    Report Status PENDING  Incomplete  Blood Culture (routine x 2)     Status: None (Preliminary result)   Collection Time: 05/14/22  4:59 PM   Specimen: BLOOD  Result Value Ref Range Status   Specimen Description BLOOD SITE NOT SPECIFIED  Final   Special Requests   Final    BOTTLES DRAWN AEROBIC AND ANAEROBIC Blood Culture results may not be optimal due to an inadequate volume of blood received in culture bottles   Culture   Final    NO GROWTH < 24 HOURS Performed at [facility], [place] [address] [place], [place] [address]    Report Status PENDING  Incomplete    Radiology Studies: CT HEAD WO CONTRAST (5MM)  Result Date: 05/14/2022 CLINICAL DATA:  Mental status change, unknown cause EXAM: CT HEAD WITHOUT CONTRAST TECHNIQUE: Contiguous axial images were obtained from the base of the skull through the vertex without intravenous contrast.  RADIATION DOSE REDUCTION: This exam was performed according to the departmental dose-optimization program which includes automated exposure control, adjustment of the mA and/or kV according to patient size and/or use of iterative reconstruction technique. COMPARISON:  None Available. FINDINGS: Brain: No evidence of acute infarction, hemorrhage, hydrocephalus, extra-axial collection or mass lesion/mass effect. Vascular: No hyperdense vessel. Skull: No acute fracture. Sinuses/Orbits: Clear sinuses.  No acute orbital finding. Other: No mastoid effusions. IMPRESSION: No evidence of acute intracranial abnormality. Electronically Signed   By: Margaretha Sheffield M.D.   On: 05/14/2022 19:28   DG Chest Port 1 View  Result Date: 05/14/2022 CLINICAL DATA:  Occlusion or sepsis. EXAM: PORTABLE CHEST 1 VIEW COMPARISON:  Chest radiograph dated 08/15/2021. FINDINGS: Right infrahilar density may represent atelectasis or developing infiltrate. Clinical correlation is recommended. The left lung is clear. No pleural effusion pneumothorax. The cardiac silhouette is within limits. Atherosclerotic calcification of the aorta. No acute osseous pathology. IMPRESSION: Right infrahilar density may represent atelectasis or developing infiltrate. Electronically Signed   By: Anner Crete M.D.   On: 05/14/2022 17:12      Morgan Rennert T. [place]  If 7PM-7AM, please contact night-coverage [email or web address] 05/15/2022, 1:00 PM

## 2022-05-15 NOTE — Progress Notes (Signed)
Nutrition Brief Note  Chart reviewed. Comfort measures have been ordered. No further nutrition interventions planned at this time.  Please re-consult as needed.   Clayborne Dana, RDN, LDN Clinical Nutrition

## 2022-05-15 NOTE — Progress Notes (Cosign Needed)
From facility  [facility]   Son is on the way from [place].  At this point would like to lean towards comfort but stable care continue IV antibiotics gentle fluids morphine as needed air hunger low doses and palliative care Tuesday.

## 2022-05-15 NOTE — Progress Notes (Signed)
Took over patient care from Laney Pot[place], [place]

## 2022-05-15 NOTE — Progress Notes (Signed)
Patient ID: Beth Pennington, female   DOB: 06/29/1940, 82 y.o.   MRN: [id number]    Progress Note from the Palliative Medicine Team at [facility]   Patient Name: Beth Pennington        Date: 05/15/2022 DOB: Jul 13, 1940  Age: 82 y.o. MRN#: [id number] Attending Physician: Mercy Riding, MD Primary Care Physician: System, Provider Not In Admit Date: 05/14/2022   Medical records reviewed, discussed with nursing   Left message with Mykhia Karson hoping to have discussion to establish plan of care.  Await callback   No charge  Wadie Lessen NP  Palliative Medicine Team Team Phone # [phone] Pager [phone]

## 2022-05-15 NOTE — Plan of Care (Signed)
  Problem: Safety: Goal: Ability to remain free from injury will improve Outcome: Progressing   

## 2022-05-16 DIAGNOSIS — N179 Acute kidney failure, unspecified: Secondary | ICD-10-CM

## 2022-05-16 LAB — HEMOGLOBIN A1C
Hgb A1c MFr Bld: 6.1 % — ABNORMAL HIGH (ref 4.8–5.6)
Mean Plasma Glucose: 128 mg/dL

## 2022-05-16 LAB — GLUCOSE, CAPILLARY
Glucose-Capillary: 127 mg/dL — ABNORMAL HIGH (ref 70–99)
Glucose-Capillary: 134 mg/dL — ABNORMAL HIGH (ref 70–99)

## 2022-05-16 NOTE — TOC Initial Note (Addendum)
Transition of Care [facility]) - Initial/Assessment Note    Patient Details  Name: Beth Pennington MRN: [id number] Date of Birth: October 02, 1940  Transition of Care [facility]) CM/SW Contact:    Beth Labrum, RN Phone Number: 05/16/2022, 4:29 PM  Clinical Narrative:                 CM spoke with Palliative Care NP and she met with the patient and family at the bedside and family would like the patient to return to [facility] with home hospice services through [facility].  I called Beth Pennington, director with [facility] and she is agreeable for patient to return to the facility tomorrow once the hospital bed and oxygen are delivered to the patient's ALF room at the facility.  [name], [place], [place] [address].  I called Beth Pennington, CM with [facility] and she accepted the referrral.  I placed dME orders for Hospice bed with APP mattress and home oxygen along with DME note and requested Beth Pennington to co-sign.  Beth Pennington, [name] will call [facility] and arrange delivery of the DME to the facility tomorrow.  FL2 completed for attending to co-sign as well.  CM will continue to follow the patient for patient's discharge to [facility] with Hospice care services - once hospital bed and oxygen are delivered tomorrow.  PTAR will be arranged.  Signed FL2 faxed to the [place] to fax # [phone].  Expected Discharge Plan: Home w Hospice Care Barriers to Discharge: Continued Medical Work up   Patient Goals and CMS Choice Patient states their goals for this hospitalization and ongoing recovery are:: Family requests patient return to [facility] with Hospice services thru [facility] CMS Medicare.gov Compare Post Acute Care list provided to:: Other (Comment Required) Beth Pennington and Beth Pennington - son, daughter-in-law) Choice offered to / list presented to : Adult [place] ownership interest  in [facility].provided to:: Adult Children    Expected Discharge Plan and Services   Discharge Planning Services: CM Consult Post Acute Care Choice: Hospice, Durable Medical Equipment Living arrangements for the past 2 months: [place] ([place])                 DME Arranged: Hospital bed, Oxygen DME Agency: [facility] Date DME Agency Contacted: 05/16/22 Time DME Agency Contacted: [phone] Representative spoke with at DME Agency: Beth Pennington, CM to call [facility] to call to arrange delivery to the facility HH Arranged: RN [facility] Agency: [place] ([place]) Date [place]: 05/16/22 Time Beth Pennington Representative spoke with at [place]: Beth Pennington, [place] with [facility]  Prior Living Arrangements/Services Living arrangements for the past 2 months: [place] ([place]) Lives with:: Facility Resident Patient language and need for interpreter reviewed:: Yes Do you feel safe going back to the place where you live?: Yes      Need for Family Participation in Patient Care: Yes (Comment) Care giver support system in place?: Yes (comment) Current home services: DME (RW at the ALF) Criminal Activity/Legal Involvement Pertinent to Current Situation/Hospitalization: No - Comment as needed  Activities of Daily Living Home Assistive Devices/Equipment: Wheelchair ADL Screening (condition at time of admission) Patient's cognitive ability adequate to safely complete daily activities?: No Is the patient deaf or have difficulty hearing?: Yes Does the patient  have difficulty seeing, even when wearing glasses/contacts?: Yes Does the patient have difficulty concentrating, remembering, or making decisions?: Yes Patient able to express need for assistance with ADLs?: No Does the patient have difficulty dressing or bathing?: Yes Independently performs  ADLs?: No Does the patient have difficulty walking or climbing stairs?: Yes Weakness of Legs: Both Weakness of Arms/Hands: Both  Permission Sought/Granted Permission sought to share information with : Case Manager, Family Supports, Customer service manager Permission granted to share information with : Yes, Verbal Permission Granted     Permission granted to share info w AGENCY: Beth Pennington, CM with [facility] - [phone], Beth Pennington, CM with [place] granted to share info w Relationship: [name] - daughter-in-law at bedside - [phone]     Emotional Assessment Appearance:: Appears stated age Attitude/Demeanor/Rapport:  (Quiet but responsive) Affect (typically observed): Stable Orientation: : Oriented to Self Alcohol / Substance Use: Not Applicable Psych Involvement: No (comment)  Admission diagnosis:  Severe sepsis ([place]) [A41.9, R65.20] Sepsis due to pneumonia ([place]) [J18.9, A41.9] Patient Active Problem List   Diagnosis Date Noted   Pressure injury of skin 05/15/2022   Lactic acidosis 05/15/2022   End of life care 05/15/2022   DNR (do not resuscitate) 05/15/2022   Severe sepsis with septic shock ([place]) 05/14/2022   Aspiration pneumonia ([place]) [id number]   Acute metabolic encephalopathy [id number]   Acute respiratory failure with hypoxia ([place]) 05/14/2022   Acute postoperative anemia due to expected blood loss 08/21/2021   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   Dementia without behavioral disturbance ([place]) 08/16/2021   Closed left hip fracture, initial encounter ([place]) 08/15/2021   Diabetes mellitus, type 2 ([place]) 06/17/2010   Hypothyroidism 06/17/2010   Hypertension 04/27/2010   Esophageal reflux 10/04/2006   PCP:  System, Provider Not In Pharmacy:   Ardeth Perfect, [place] [id number] [place] [address] Phone: [phone] Fax:  [phone]     Social Determinants of Health (SDOH) Social History: SDOH Screenings   Tobacco Use: Medium Risk (05/14/2022)   SDOH Interventions:     Readmission Risk Interventions    05/16/2022    4:29 PM  Readmission Risk Prevention Plan  Transportation Screening Complete  PCP or Specialist Appt within 3-5 Days Complete  HRI or [place] Complete  Social Work Consult for [place] Planning/Counseling Complete  Palliative Care Screening Complete  Medication Review Press photographer) Complete

## 2022-05-16 NOTE — Progress Notes (Signed)
Palliative:  HPI: 82 year old F with PMH of severe dementia, DM-2, HTN, HLD, hypothyroidism and GERD presenting with confusion, cough and shortness of breath, and found to be in septic shock likely from possible aspiration pneumonia.  She was admitted for end-of-life care,  but with continuation of antibiotics, supportive measures hoping for son to have the opportunity to see his mother. Patient's son is flying in from [place].     I met today at Ms. Goding's bedside with daughter-in-law, Selinda Eon. Ms. Sample is lying in bed and resting comfortably. Selinda Eon shares that Ms. Taffe was able to have good time with her son since he arrived yesterday. She has been more alert earlier today and they were even able to get her to eat/drink a little. She has required no PRN medications and appears comfortable. We discussed path forward and Selinda Eon shares that they are interested in transition back to [facility] with assistance from [place]. They had a visit from nursing director from [facility] earlier so we believe this is a realistic option. We discussed that she appears stable for transfer at this time and we will consider a different plan if this changes.   All questions/concerns addressed. Emotional support provided. Updated Dr. [name] and CMRN to assist with discharge plans.   Exam: Lethargic. Confused. Awakens for brief period. No distress. Mouth breathing. Shallow breathing but regular. Warm to touch.   Plan: - Full comfort care.  - Discharge back to [place] with [facility] hospice support.   [place], NP Palliative Medicine Team Pager [phone] (Please see [email or web address] for schedule) Team Phone [phone]    Greater than 50%  of this time was spent counseling and coordinating care related to the above assessment and plan

## 2022-05-16 NOTE — Progress Notes (Signed)
OT Cancellation Note  Patient Details Name: Beth Pennington MRN: [id number] DOB: March 05, 1941   Cancelled Treatment:    Reason Eval/Treat Not Completed: OT screened, no needs identified, will sign off Noted pt from memory care, now going full comfort measures. OT will sign off at acute level. Please reconsult if needs change.  Layla Maw 05/16/2022, 12:39 PM

## 2022-05-16 NOTE — NC FL2 (Signed)
[place] LEVEL OF CARE FORM     IDENTIFICATION  Patient Name: Beth Pennington Birthdate: 06/29/1940 Sex: female Admission Date (Current Location): 05/14/2022  [facility] and [place] Number:  Herbalist and Address:  [facility] [place]. [facility], [place] [address] [place], [place] [address]      Provider Number: [id number]  Attending Physician Name and Address:  Modena Jansky, MD  Relative Name and Phone Number:  Worm,scott Son   [phone]    Current Level of Care: Hospital Recommended Level of Care: Nursing Facility Prior Approval Number:    Date Approved/Denied:   PASRR Number:    Discharge Plan: SNF    Current Diagnoses: Patient Active Problem List   Diagnosis Date Noted   Pressure injury of skin 05/15/2022   Lactic acidosis 05/15/2022   End of life care 05/15/2022   DNR (do not resuscitate) 05/15/2022   Severe sepsis with septic shock ([place]) 05/14/2022   Aspiration pneumonia ([place]) [id number]   Acute metabolic encephalopathy [id number]   Acute respiratory failure with hypoxia ([place]) 05/14/2022   Acute postoperative anemia due to expected blood loss 08/21/2021   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   Dementia without behavioral disturbance ([place]) 08/16/2021   Closed left hip fracture, initial encounter ([place]) 08/15/2021   Diabetes mellitus, type 2 ([place]) 06/17/2010   Hypothyroidism 06/17/2010   Hypertension 04/27/2010   Esophageal reflux 10/04/2006    Orientation RESPIRATION BLADDER Height & Weight     Self  O2 Incontinent, External catheter Weight: 116 lb 6.5 oz (52.8 kg) Height:  '5\' 2"'$  (157.5 cm)  BEHAVIORAL SYMPTOMS/MOOD NEUROLOGICAL BOWEL NUTRITION STATUS        Diet (see dc summary)  AMBULATORY STATUS COMMUNICATION OF NEEDS Skin   Extensive Assist   Normal                       Personal Care Assistance Level of Assistance  Total care   Feeding assistance: Maximum assistance   Total Care Assistance: Maximum  assistance   Functional Limitations Info  Sight, Hearing, Speech Sight Info: Adequate Hearing Info: Adequate Speech Info: Adequate    SPECIAL CARE FACTORS FREQUENCY   (Comfort care)                    Contractures      Additional Factors Info  Code Status Code Status Info: DNR             Current Medications (05/16/2022):  This is the current hospital active medication list Current Facility-Administered Medications  Medication Dose Route Frequency Provider Last Rate Last Admin   acetaminophen (TYLENOL) tablet 650 mg  650 mg Oral Q6H PRN Toy Baker, MD       Or   acetaminophen (TYLENOL) suppository 650 mg  650 mg Rectal Q6H PRN Toy Baker, MD   650 mg at 05/15/22 0336   albuterol (PROVENTIL) (2.5 MG/3ML) 0.083% nebulizer solution 2.5 mg  2.5 mg Nebulization Q2H PRN Doutova, Nyoka Lint, MD       antiseptic oral rinse (BIOTENE) solution 15 mL  15 mL Topical PRN Doutova, Anastassia, MD       glycopyrrolate (ROBINUL) tablet 1 mg  1 mg Oral Q4H PRN Doutova, Anastassia, MD       Or   glycopyrrolate (ROBINUL) injection 0.2 mg  0.2 mg Subcutaneous Q4H PRN Doutova, Anastassia, MD       Or   glycopyrrolate (ROBINUL) injection 0.2 mg  0.2 mg Intravenous Q4H PRN Toy Baker, MD   0.2 mg at 05/15/22 0030   haloperidol (HALDOL) tablet 0.5 mg  0.5 mg Oral Q4H PRN Toy Baker, MD       Or   haloperidol (HALDOL) 2 MG/ML solution 0.5 mg  0.5 mg Sublingual Q4H PRN Doutova, Anastassia, MD       Or   haloperidol lactate (HALDOL) injection 0.5 mg  0.5 mg Intravenous Q4H PRN Doutova, Anastassia, MD       HYDROcodone-acetaminophen (NORCO/VICODIN) 5-325 MG per tablet 1-2 tablet  1-2 tablet Oral Q4H PRN Doutova, Anastassia, MD       morphine (PF) 2 MG/ML injection 1 mg  1 mg Intravenous Q4H PRN Doutova, Anastassia, MD   1 mg at 05/14/22 2357   ondansetron (ZOFRAN-ODT) disintegrating tablet 4 mg  4 mg Oral Q6H PRN Toy Baker, MD       Or   ondansetron  (ZOFRAN) injection 4 mg  4 mg Intravenous Q6H PRN Doutova, Anastassia, MD       polyvinyl alcohol (LIQUIFILM TEARS) 1.4 % ophthalmic solution 1 drop  1 drop Both Eyes QID PRN Toy Baker, MD         Discharge Medications: Please see discharge summary for a list of discharge medications.  Relevant Imaging Results:  Relevant Lab Results:   Additional Information    Jinger Neighbors, LCSW

## 2022-05-16 NOTE — Progress Notes (Signed)
    Durable Medical Equipment  (From admission, onward)           Start     Ordered   05/16/22 1614  For home use only DME oxygen  Once       Question Answer Comment  Length of Need Lifetime   Mode or (Route) Nasal cannula   Liters per Minute 3   Frequency Continuous (stationary and portable oxygen unit needed)   Oxygen conserving device Yes   Oxygen delivery system Gas      05/16/22 1614   05/16/22 1613  For home use only DME Hospital bed  Once       Comments: Hospice patient with [facility]  Question Answer Comment  Length of Need Lifetime   Patient has (list medical condition): Comfort Care for End of Life Care, Hospice patient   The above medical condition requires: Patient requires the ability to reposition frequently   Head must be elevated greater than: 30 degrees   Bed type Semi-electric   Support Surface: Alternating Pressure Pad and Pump      05/16/22 1614           [place]

## 2022-05-16 NOTE — Evaluation (Signed)
Clinical/Bedside Swallow Evaluation Patient Details  Name: Beth Pennington MRN: [id number] Date of Birth: 03/11/1941  Today's Date: 05/16/2022 Time: SLP Start Time (ACUTE ONLY): [id number] SLP Stop Time (ACUTE ONLY): [id number] SLP Time Calculation (min) (ACUTE ONLY): 13 min  Past Medical History:  Past Medical History:  Diagnosis Date   Constipation    Dementia ([place])    Diabetes mellitus without complication ([place])    Hypercholesterolemia    Hypertension    Thyroid disease    Vitamin B12 deficiency    Past Surgical History:  Past Surgical History:  Procedure Laterality Date   ABDOMINAL HYSTERECTOMY     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   Procedure: TOTAL HIP ARTHROPLASTY ANTERIOR APPROACH;  Surgeon: Rod Can, MD;  Location: [facility] ORS;  Service: Orthopedics;  Laterality: Left;   HPI:  82 year old F with PMH of severe dementia, DM-2, HTN, HLD, hypothyroidism and GERD presenting with confusion, cough and shortness of breath, and found to be in septic shock likely from possible aspiration pneumonia.  She was admitted for end-of-life care,  but with continuation of antibiotics, supportive measures hoping for son to have the opportunity to see his mother per Palliative care note.  Patient's son is flying in from [place].    Assessment / Plan / Recommendation  Clinical Impression  Pt alert with son at bedside for swallow assessment exhibiting a cognitive based mild dysphagia. She presents with intact dentition and generalized oromotor imprecision with lingual movements. Expressing herself with decreased semantics and relevancy which son states is close to her baseline (lives in memory care facility). He states the facility was concerned about her swallow but he never heard any updates. Today she was able to consume sips via spoon, cup and straw use was inconsistent due to congitive impairments and coordinating use of straw. Swallows appeared timely without cough, or other signs of aspiration.  Manipulated applesauce and initiated rather timely swallow. Given cognitive impairments, and current illness did not attempt solid texture at this time and son in agreement this morniung. Pt may be moving to comfort care and therapist can continue to follow for possible advancement of solids textures from puree. Discussed relationship of dementia and swallowing with son and what he may expect (holding, pocketing etc). Order written for puree diet, thin liquids, crush meds, upright posture and small sips/bites. ST will follow for ability to upgrade texture. SLP Visit Diagnosis: Dysphagia, unspecified (R13.10)    Aspiration Risk  Mild aspiration risk    Diet Recommendation Dysphagia 1 (Puree);Thin liquid   Liquid Administration via: Cup;Straw Medication Administration: Crushed with puree Supervision: Staff to assist with self feeding;Full supervision/cueing for compensatory strategies Compensations: Minimize environmental distractions;Slow rate;Small sips/bites Postural Changes: Seated upright at 90 degrees    Other  Recommendations Oral Care Recommendations: Oral care BID    Recommendations for follow up therapy are one component of a multi-disciplinary discharge planning process, led by the attending physician.  Recommendations may be updated based on patient status, additional functional criteria and insurance authorization.  Follow up Recommendations  (TBD)      Assistance Recommended at Discharge    Functional Status Assessment Patient has had a recent decline in their functional status and demonstrates the ability to make significant improvements in function in a reasonable and predictable amount of time.  Frequency and Duration            Prognosis        Swallow Study   General HPI: 82 year old F with PMH of severe dementia,  DM-2, HTN, HLD, hypothyroidism and GERD presenting with confusion, cough and shortness of breath, and found to be in septic shock likely from possible  aspiration pneumonia.  She was admitted for end-of-life care,  but with continuation of antibiotics, supportive measures hoping for son to have the opportunity to see his mother per Palliative care note.  Patient's son is flying in from [place]. Type of Study: Bedside Swallow Evaluation Previous Swallow Assessment:  (none) Diet Prior to this Study: NPO Temperature Spikes Noted: No Respiratory Status: Nasal cannula History of Recent Intubation: No Behavior/Cognition: Alert;Cooperative;Pleasant mood;Distractible;Requires cueing Oral Cavity Assessment: Within Functional Limits Oral Care Completed by SLP: No Oral Cavity - Dentition: Adequate natural dentition Vision: Functional for self-feeding Self-Feeding Abilities: Needs assist Patient Positioning: Upright in bed Baseline Vocal Quality: Normal Volitional Cough: Cognitively unable to elicit Volitional Swallow: Unable to elicit    Oral/Motor/Sensory Function Overall Oral Motor/Sensory Function: Generalized oral weakness   Ice Chips Ice chips: Not tested   Thin Liquid Thin Liquid: Within functional limits Presentation: Cup;Straw;Spoon    Nectar Thick Nectar Thick Liquid: Not tested   Honey Thick Honey Thick Liquid: Not tested   Puree Puree: Within functional limits   Solid     Solid: Not tested      Houston Siren 05/16/2022,8:59 AM

## 2022-05-16 NOTE — Care Management Important Message (Signed)
Important Message  Patient Details  Name: Beth Pennington MRN: [id number] Date of Birth: Apr 25, 1940   Medicare Important Message Given:  Yes     Orbie Pyo 05/16/2022, 2:29 PM

## 2022-05-16 NOTE — Progress Notes (Signed)
PT Cancellation Note  Patient Details Name: Beth Pennington MRN: [id number] DOB: 02-Oct-1940   Cancelled Treatment:    Reason Eval/Treat Not Completed: (P) Other (comment) PT screened, no needs identified, will sign off Noted pt from memory care, now going full comfort measures. PT will sign off at acute level. Please reconsult if needs change.   [name] B. Migdalia Dk PT, DPT Acute Rehabilitation Services Please use secure chat or  Call Office [phone]  [place] 05/16/2022, 12:45 PM

## 2022-05-16 NOTE — Progress Notes (Signed)
PROGRESS NOTE   Beth Pennington  [id number]    DOB: 1941-01-30    DOA: 05/14/2022  PCP: System, Provider Not In   I have briefly reviewed patients previous medical records in [facility].  Chief Complaint  Patient presents with   Altered Mental Status    Brief Narrative:  82 year old widowed female, resident of a memory care unit for the last approximately 2 years, mostly nonambulatory/wheelchair mobile, medical history significant for severe advanced Alzheimer's dementia, type II DM, HTN, HLD, hypothyroidism, GERD, presented from the facility with complaints of confusion, cough and dyspnea.  Admitted for septic shock likely due to aspiration pneumonia from AMS and dysphagia.  Initially treated aggressively with IV antibiotics etc.  Patient's son just arrived from [place] on 2/6 and wishes to transition patient to full comfort care.  Palliative care on board.  Will need to figure out discharge disposition i.e. may not be appropriate for residential hospice yet and not sure if memory care unit can accept patient back with hospice.   Assessment & Plan:  Principal Problem:   Severe sepsis with septic shock (HCC) Active Problems:   Diabetes mellitus, type 2 (HCC)   Dementia without behavioral disturbance (HCC)   Aspiration pneumonia (HCC)   Acute metabolic encephalopathy   Acute respiratory failure with hypoxia (HCC)   Pressure injury of skin   Lactic acidosis   End of life care   Esophageal reflux   Hypertension   Hypothyroidism   DNR (do not resuscitate)   Severe sepsis with septic shock likely due to aspiration pneumonia: POA.   Tachycardia, tachypnea, fever, leukocytosis, respiratory failure, encephalopathy and lactic acidosis.  CXR showed right infrahilar density.  Procalcitonin elevated.  Urinalysis without significant finding.  Blood culture remains NGTD.  SARS coronavirus 2 by RT-PCR, influenza A and B and RSV PCR negative.  Given her severe dementia and poor prognosis,  patient was admitted for comfort care but with continuation of IV antibiotics until further goals of care discussion.  Palliative medicine following. Patient's son just arrived from [place] on 2/6 and wishes to transition patient to full comfort care.  As discussed with son at bedside, discontinued all measures nonessential to comfort i.e. labs, IV antibiotics etc. Will need to figure out discharge disposition i.e. may not be appropriate for residential hospice yet and not sure if memory care unit can accept patient back with hospice.   Acute respiratory failure with hypoxia:  Likely secondary to aspiration pneumonia.  Remains on 5 L/min [place] [facility] [place] oxygen and saturating up to 100%.  Oxygen now for comfort.   Acute metabolic encephalopathy:  Secondary to acute illness i.e. septic shock secondary to aspiration pneumonia, acute respiratory failure with hypoxia complicating underlying dementia without behavioral disturbance.  As per son's report, prior to admission, patient was still sleeping a lot and currently in and out of awake/alertness.  CT head without acute findings.  Anemia: No further lab draws  Acute kidney injury Creatinine 0.89 in June 2023.  Peaked this admission to 1.27.   End-of-life care/full comfort care/DNR/DNI Palliative care medicine input appreciated.  I have requested them to follow-up regarding further guidance regarding goals of care and discharge disposition.  As discussed with son extensively at bedside, he now wishes to transition her to full comfort care.   Hypotension/history of hypertension:  Hypotension/shock has resolved.  Holding antihypertensives.   Hypothyroidism Given comfort care choice, continue to hold Synthroid.   Lactic acidosis No further lab draw   Hypomagnesemia  Body mass  index is 21.29 kg/m.   Pressure Ulcer: Pressure Injury 05/14/22 Coccyx Mid Stage 1 -  Intact skin with non-blanchable redness of a localized area usually over a bony prominence.  (Active)  05/14/22 2345  Location: Coccyx  Location Orientation: Mid  Staging: Stage 1 -  Intact skin with non-blanchable redness of a localized area usually over a bony prominence.  Wound Description (Comments):   Present on Admission: Yes  Dressing Type Other (Comment) 05/15/22 1957     DVT prophylaxis: SCDs Start: 05/15/22 1358     Code Status: DNR:  ACP Documents: Present and reviewed.  Has a living will, reviewed. Family Communication:  Disposition:  Status is: Inpatient Remains inpatient appropriate because: Pending discharge disposition after palliative care team input and TOC input.     Consultants:   Palliative care medicine  Procedures:     Antimicrobials:   Discontinued   Subjective:  Patient unable to provide any history due to advanced dementia and mostly nonverbal.  History obtained from patient's son at bedside who just came in from [place] last night.  History as noted above.  Indicates that she has waxing and waning alertness.  Barely recognizes her family, because her son by her spouse's name.  He has noted some labored breathing.  Objective:   Vitals:   05/15/22 0900 05/15/22 1957 05/16/22 0022 05/16/22 0456  BP:  (!) 88/75 129/71 (!) 154/121  Pulse: (!) 106 (!) 103 (!) 113 99  Resp: (!) 22 17 (!) 22 13  Temp:  98 F (36.7 C) 98.4 F (36.9 C) 98.5 F (36.9 C)  TempSrc:  Oral Axillary Axillary  SpO2: 100% 95% 100% 100%  Weight:      Height:        General exam: Elderly female, moderately built, chronically ill looking, frail, lying propped up in bed. Respiratory system: Harsh breath sounds bilaterally with scattered occasional crackles.  No wheezing or rhonchi.  Mild intermittent increased work of breathing.  On [place] oxygen 5 L/min. Cardiovascular system: S1 & S2 heard, RRR. No JVD, murmurs, rubs, gallops or clicks. No pedal edema.  Telemetry personally reviewed: SR-intermittent mild sinus tachycardia in the 100s-110s. Gastrointestinal system:  Abdomen is nondistended, soft and nontender. No organomegaly or masses felt. Normal bowel sounds heard. Central nervous system: Waxing and waning alertness.  When alert, oriented only to self.  Does not follow instructions. No focal neurological deficits. Extremities: Symmetric 5 x 5 power. Skin: No rashes, lesions or ulcers Psychiatry: Judgement and insight impaired. Mood & affect cannot be assessed.     Data Reviewed:   I have personally reviewed following labs and imaging studies   CBC: Recent Labs  Lab 05/14/22 1700 05/14/22 2219 05/15/22 1435  WBC 11.8*  --  9.3  NEUTROABS 9.9*  --   --   HGB 12.4 11.6* 10.3*  HCT 38.8 34.0* 31.5*  MCV 99.0  --  98.4  PLT 269  --  123456    Basic Metabolic Panel: Recent Labs  Lab 05/14/22 1700 05/14/22 2207 05/14/22 2219 05/15/22 1435  NA 138  --  138 139  K 4.2  --  4.1 4.3  CL 101  --   --  104  CO2 24  --   --  23  GLUCOSE 205*  --   --  100*  BUN 28*  --   --  28*  CREATININE 1.07*  --   --  1.27*  CALCIUM 9.3  --   --  8.4*  MG  --  1.3*  --  1.5*  PHOS  --  3.1  --  3.8    Liver Function Tests: Recent Labs  Lab 05/14/22 1700 05/15/22 1435  AST 23 25  ALT 11 9  ALKPHOS 60 39  BILITOT 1.0 0.8  PROT 6.8 5.3*  ALBUMIN 3.5 2.5*    CBG: Recent Labs  Lab 05/15/22 1741 05/16/22 0023 05/16/22 0524  GLUCAP 86 134* 127*    Microbiology Studies:   Recent Results (from the past 240 hour(s))  Resp panel by RT-PCR (RSV, Flu A&B, Covid) Anterior Nasal Swab     Status: None   Collection Time: 05/14/22  4:51 PM   Specimen: Anterior Nasal Swab  Result Value Ref Range Status   SARS Coronavirus 2 by RT PCR NEGATIVE NEGATIVE Final   Influenza A by PCR NEGATIVE NEGATIVE Final   Influenza B by PCR NEGATIVE NEGATIVE Final    Comment: (NOTE) The Xpert Xpress SARS-CoV-2/FLU/RSV plus assay is intended as an aid in the diagnosis of influenza from Nasopharyngeal swab specimens and should not be used as a sole basis for  treatment. Nasal washings and aspirates are unacceptable for Xpert Xpress SARS-CoV-2/FLU/RSV testing.  Fact Sheet for Patients: EntrepreneurPulse.com.au  Fact Sheet for Healthcare Providers: IncredibleEmployment.be  This test is not yet approved or cleared by the [place] FDA and has been authorized for detection and/or diagnosis of SARS-CoV-2 by FDA under an Emergency Use Authorization (EUA). This EUA will remain in effect (meaning this test can be used) for the duration of the COVID-19 declaration under Section 564(b)(1) of the Act, 21 U.S.C. section 360bbb-3(b)(1), unless the authorization is terminated or revoked.     Resp Syncytial Virus by PCR NEGATIVE NEGATIVE Final    Comment: (NOTE) Fact Sheet for Patients: EntrepreneurPulse.com.au  Fact Sheet for Healthcare Providers: IncredibleEmployment.be  This test is not yet approved or cleared by the [place] FDA and has been authorized for detection and/or diagnosis of SARS-CoV-2 by FDA under an Emergency Use Authorization (EUA). This EUA will remain in effect (meaning this test can be used) for the duration of the COVID-19 declaration under Section 564(b)(1) of the Act, 21 U.S.C. section 360bbb-3(b)(1), unless the authorization is terminated or revoked.  Performed at [facility], [place] [address] [place], [place] [address]   Blood Culture (routine x 2)     Status: None (Preliminary result)   Collection Time: 05/14/22  4:58 PM   Specimen: BLOOD  Result Value Ref Range Status   Specimen Description BLOOD SITE NOT SPECIFIED  Final   Special Requests   Final    BOTTLES DRAWN AEROBIC AND ANAEROBIC Blood Culture adequate volume   Culture   Final    NO GROWTH 2 DAYS Performed at [facility] [address], [place], [place] [address]    Report Status PENDING  Incomplete  Blood Culture (routine x 2)     Status: None (Preliminary  result)   Collection Time: 05/14/22  4:59 PM   Specimen: BLOOD  Result Value Ref Range Status   Specimen Description BLOOD SITE NOT SPECIFIED  Final   Special Requests   Final    BOTTLES DRAWN AEROBIC AND ANAEROBIC Blood Culture results may not be optimal due to an inadequate volume of blood received in culture bottles   Culture   Final    NO GROWTH 2 DAYS Performed at [facility], [place] [address] [place], [place] [address]    Report Status  PENDING  Incomplete    Radiology Studies:  CT HEAD WO CONTRAST (5MM)  Result Date: 05/14/2022 CLINICAL DATA:  Mental status change, unknown cause EXAM: CT HEAD WITHOUT CONTRAST TECHNIQUE: Contiguous axial images were obtained from the base of the skull through the vertex without intravenous contrast. RADIATION DOSE REDUCTION: This exam was performed according to the departmental dose-optimization program which includes automated exposure control, adjustment of the mA and/or kV according to patient size and/or use of iterative reconstruction technique. COMPARISON:  None Available. FINDINGS: Brain: No evidence of acute infarction, hemorrhage, hydrocephalus, extra-axial collection or mass lesion/mass effect. Vascular: No hyperdense vessel. Skull: No acute fracture. Sinuses/Orbits: Clear sinuses.  No acute orbital finding. Other: No mastoid effusions. IMPRESSION: No evidence of acute intracranial abnormality. Electronically Signed   By: Margaretha Sheffield M.D.   On: 05/14/2022 19:28   DG Chest Port 1 View  Result Date: 05/14/2022 CLINICAL DATA:  Occlusion or sepsis. EXAM: PORTABLE CHEST 1 VIEW COMPARISON:  Chest radiograph dated 08/15/2021. FINDINGS: Right infrahilar density may represent atelectasis or developing infiltrate. Clinical correlation is recommended. The left lung is clear. No pleural effusion pneumothorax. The cardiac silhouette is within limits. Atherosclerotic calcification of the aorta. No acute osseous pathology. IMPRESSION: Right infrahilar  density may represent atelectasis or developing infiltrate. Electronically Signed   By: Anner Crete M.D.   On: 05/14/2022 17:12    Scheduled Meds:    insulin aspart  0-9 Units Subcutaneous Q4H    Continuous Infusions:    sodium chloride 50 mL/hr at 05/15/22 0900   ampicillin-sulbactam (UNASYN) IV 3 g (05/16/22 0537)     LOS: 2 days     Vernell Leep, MD,  FACP, [facility], [facility], [facility], [place]     To contact the attending provider between 7A-7P or the covering provider during after hours 7P-7A, please log into the web site [email or web address] and access using universal [place] password for that web site. If you do not have the password, please call the hospital operator.  05/16/2022, 9:20 AM

## 2022-05-17 MED ORDER — ACETAMINOPHEN 325 MG PO TABS: 650.0000 mg | ORAL_TABLET | Freq: Four times a day (QID) | ORAL | Status: AC | PRN

## 2022-05-17 NOTE — Discharge Summary (Signed)
Physician Discharge Summary  Beth Pennington [id number] DOB: 1940-09-21  PCP: System, Provider Not In  Admitted from: [facility]/memory care unit Discharged to: Returning to [facility]/memory care unit with hospice  Admit date: 05/14/2022 Discharge date: 05/17/2022  Recommendations for Outpatient Follow-up:    Follow-up Information     MD at [facility]. Schedule an appointment as soon as possible for a visit.                   Home Health: None    Equipment/Devices:     Durable Medical Equipment  (From admission, onward)           Start     Ordered   05/17/22 0916  For home use only DME oxygen  Once       Question Answer Comment  Length of Need Lifetime   Mode or (Route) Nasal cannula   Liters per Minute 2   Frequency Continuous (stationary and portable oxygen unit needed)   Oxygen conserving device Yes   Oxygen delivery system Gas      05/17/22 0917   05/16/22 1613  For home use only DME Hospital bed  Once       Comments: Hospice patient with [facility]  Question Answer Comment  Length of Need Lifetime   Patient has (list medical condition): Comfort Care for End of Life Care, Hospice patient   The above medical condition requires: Patient requires the ability to reposition frequently   Head must be elevated greater than: 30 degrees   Bed type Semi-electric   Support Surface: Alternating Pressure Pad and Pump      05/16/22 1614             Discharge Condition: Guarded.  At risk for decline and even death.  Family aware.   Code Status: DNR Diet recommendation:  Discharge Diet Orders (From admission, onward)     Start     Ordered   05/17/22 0000  Diet general       Comments: Diet consistency: Dysphagia 1 diet and thin liquids.  Aspiration precautions.  Comfort feeds of choice.   05/17/22 0936             Discharge Diagnoses:  Principal Problem:   Severe sepsis with septic shock ([place]) Active Problems:    Diabetes mellitus, type 2 ([place])   Dementia without behavioral disturbance (HCC)   Aspiration pneumonia (HCC)   Acute metabolic encephalopathy   Acute respiratory failure with hypoxia (HCC)   Pressure injury of skin   Lactic acidosis   End of life care   Esophageal reflux   Hypertension   Hypothyroidism   DNR (do not resuscitate)   Brief Summary: 82 year old widowed female, resident of a memory care unit for the last approximately 2 years, mostly nonambulatory/wheelchair mobile, medical history significant for severe advanced Alzheimer's dementia, type II DM, HTN, HLD, hypothyroidism, GERD, presented from the facility with complaints of confusion, cough and dyspnea.  Admitted for septic shock likely due to aspiration pneumonia from AMS and dysphagia.  Initially treated aggressively with IV antibiotics etc.  Patient's son just arrived from [place] on 2/6 and transitioned patient to full comfort care.  Palliative care on board.  Patient will return to prior memory care unit with hospice.     Assessment & Plan:   Severe sepsis with septic shock likely due to aspiration pneumonia: POA.   Tachycardia, tachypnea, fever, leukocytosis, respiratory failure, encephalopathy and lactic acidosis.  CXR  showed right infrahilar density.  Procalcitonin elevated.  Urinalysis without significant finding.  Blood culture remains NGTD.  SARS coronavirus 2 by RT-PCR, influenza A and B and RSV PCR negative.  Given her severe dementia and poor prognosis, patient was admitted for comfort care but with continuation of IV antibiotics until further goals of care discussion.  Palliative medicine following. Patient's son just arrived from [place] on 2/6 and transitioned patient to full comfort care.  As discussed with son at bedside, discontinued all measures nonessential to comfort i.e. labs, IV antibiotics etc. Palliative care follow-up appreciated.  Discussed with them yesterday.  Also communicated with TOC team.  Patient  is being discharged back to prior ALF with hospice services.  Continue oxygen at 2 L/min via [place] and can be titrated as needed for comfort.  Discussed with patient's son Beth Pennington at bedside in detail.  He remains consistent with plans to continue comfort oriented care.  He wishes to however continue some of her behavioral health meds as noted below but agreeable to stop all the medications nonessential to comfort.   Acute respiratory failure with hypoxia:  Likely secondary to aspiration pneumonia.  Previously was on 5 L/min [place] oxygen, now on 3 L and saturating anywhere between 94-100%.  Can discharge on 2 L/min [place] oxygen which can be titrated as needed at ALF for comfort.   Acute metabolic encephalopathy:  Secondary to acute illness i.e. septic shock secondary to aspiration pneumonia, acute respiratory failure with hypoxia complicating underlying dementia without behavioral disturbance.  As per son's report, prior to admission, patient was still sleeping a lot and currently in and out of awake/alertness.  CT head without acute findings.  As reported by son today, her mentation is back to her baseline.   Anemia: No further lab draws   Acute kidney injury Creatinine 0.89 in June 2023.  Peaked this admission to 1.27.   End-of-life care/full comfort care/DNR/DNI Palliative care medicine input appreciated.     Hypotension/history of hypertension:  Hypotension/shock has resolved.  Discontinued all medications nonessential to comfort except the behavioral health meds.   Hypothyroidism Given comfort care choice, discontinued Synthroid.   Lactic acidosis No further lab draw   Hypomagnesemia   Body mass index is 21.29 kg/m.     Pressure Ulcer:     Pressure Injury 05/14/22 Coccyx Mid Stage 1 -  Intact skin with non-blanchable redness of a localized area usually over a bony prominence. (Active)  05/14/22 2345  Location: Coccyx  Location Orientation: Mid  Staging: Stage 1 -  Intact skin with  non-blanchable redness of a localized area usually over a bony prominence.  Wound Description (Comments):   Present on Admission: Yes  Dressing Type Other (Comment) 05/15/22 1957        Consultants:   Palliative care medicine   Procedures:    Discharge Instructions  Discharge Instructions     Call MD for:  difficulty breathing, headache or visual disturbances   Complete by: As directed    Call MD for:  extreme fatigue   Complete by: As directed    Call MD for:  persistant dizziness or light-headedness   Complete by: As directed    Call MD for:  persistant nausea and vomiting   Complete by: As directed    Call MD for:  severe uncontrolled pain   Complete by: As directed    Call MD for:  temperature >100.4   Complete by: As directed    Diet general   Complete by:  As directed    Diet consistency: Dysphagia 1 diet and thin liquids.  Aspiration precautions.  Comfort feeds of choice.   Increase activity slowly   Complete by: As directed    No wound care   Complete by: As directed         Medication List     STOP taking these medications    aspirin 81 MG chewable tablet   atorvastatin 20 MG tablet Commonly known as: LIPITOR   bisacodyl 10 MG suppository Commonly known as: DULCOLAX   cyanocobalamin 1000 MCG tablet Commonly known as: VITAMIN B12   GLUCERNA PO   levothyroxine 88 MCG tablet Commonly known as: SYNTHROID   losartan 50 MG tablet Commonly known as: COZAAR   memantine 5 MG tablet Commonly known as: NAMENDA   metFORMIN 500 MG tablet Commonly known as: GLUCOPHAGE   metoprolol tartrate 25 MG tablet Commonly known as: LOPRESSOR   potassium chloride 10 MEQ CR capsule Commonly known as: MICRO-K   senna 8.6 MG Tabs tablet Commonly known as: SENOKOT       TAKE these medications    acetaminophen 325 MG tablet Commonly known as: TYLENOL Take 2 tablets (650 mg total) by mouth every 6 (six) hours as needed for mild pain, moderate pain, fever  or headache (or Fever >/= 101).   Calmoseptine 0.44-20.6 % Oint Generic drug: Menthol-Zinc Oxide Apply 1 application  topically every 8 (eight) hours as needed (irritation). To buttocks   divalproex 125 MG capsule Commonly known as: DEPAKOTE SPRINKLE Take 250 mg by mouth 2 (two) times daily.   FLUoxetine 20 MG/5ML solution Commonly known as: PROZAC Take 10 mg by mouth daily.   melatonin 3 MG Tabs tablet Take 3 mg by mouth at bedtime.   METAMUCIL FIBER PO Take 0.4 g by mouth daily.   OcuSoft Lid Scrub Plus Pads Apply 1 each topically at bedtime. To both eyelids   OLANZapine 5 MG tablet Commonly known as: ZYPREXA Take 5 mg by mouth at bedtime.   polyethylene glycol 17 g packet Commonly known as: MIRALAX / GLYCOLAX Take 17 g by mouth daily.   Thick-It Pack Generic drug: STARCH-MALTO DEXTRIN Take 1 each by mouth See admin instructions. Per MAR, 4 times daily as directed (at 0700, 0800, 1700, 2000)       No Known Allergies    Procedures/Studies: CT HEAD WO CONTRAST (5MM)  Result Date: 05/14/2022 CLINICAL DATA:  Mental status change, unknown cause EXAM: CT HEAD WITHOUT CONTRAST TECHNIQUE: Contiguous axial images were obtained from the base of the skull through the vertex without intravenous contrast. RADIATION DOSE REDUCTION: This exam was performed according to the departmental dose-optimization program which includes automated exposure control, adjustment of the mA and/or kV according to patient size and/or use of iterative reconstruction technique. COMPARISON:  None Available. FINDINGS: Brain: No evidence of acute infarction, hemorrhage, hydrocephalus, extra-axial collection or mass lesion/mass effect. Vascular: No hyperdense vessel. Skull: No acute fracture. Sinuses/Orbits: Clear sinuses.  No acute orbital finding. Other: No mastoid effusions. IMPRESSION: No evidence of acute intracranial abnormality. Electronically Signed   By: Margaretha Sheffield M.D.   On: 05/14/2022 19:28    DG Chest Port 1 View  Result Date: 05/14/2022 CLINICAL DATA:  Occlusion or sepsis. EXAM: PORTABLE CHEST 1 VIEW COMPARISON:  Chest radiograph dated 08/15/2021. FINDINGS: Right infrahilar density may represent atelectasis or developing infiltrate. Clinical correlation is recommended. The left lung is clear. No pleural effusion pneumothorax. The cardiac silhouette is within limits. Atherosclerotic calcification of the  aorta. No acute osseous pathology. IMPRESSION: Right infrahilar density may represent atelectasis or developing infiltrate. Electronically Signed   By: Anner Crete M.D.   On: 05/14/2022 17:12      Subjective: Alert and oriented to self.  As per son at bedside, patient back to her baseline.  Coughing but swallowing sputum.  No pain.  Has consumed some applesauce.  Has not received much of as needed meds.  Discharge Exam:  Vitals:   05/16/22 0456 05/16/22 0925 05/16/22 1414 05/16/22 2000  BP: (!) 154/121 (!) 148/69 (!) 163/95 (!) 163/120  Pulse: 99 98 (!) 104 (!) 115  Resp: '13 16 19 20  '$ Temp: 98.5 F (36.9 C) 100.2 F (37.9 C) 99.4 F (37.4 C) 100.1 F (37.8 C)  TempSrc: Axillary Axillary Oral Oral  SpO2: 100% 97% 100% 94%  Weight:      Height:        General exam: Elderly female, moderately built, chronically ill looking, frail, sitting up comfortably in bed without distress.  Ongoing intermittent wet sounding cough. Respiratory system: Harsh breath sounds bilaterally, but better than yesterday, with scattered occasional crackles.  No wheezing or rhonchi.  Mild intermittent increased work of breathing.  Cardiovascular system: S1 & S2 heard, RRR. No JVD, murmurs, rubs, gallops or clicks. No pedal edema.  Off telemetry. Gastrointestinal system: Abdomen is nondistended, soft and nontender. No organomegaly or masses felt. Normal bowel sounds heard. Central nervous system: Alert and oriented to self.  Does not follow instructions. No focal neurological  deficits. Extremities: Symmetric 5 x 5 power. Skin: No rashes, lesions or ulcers Psychiatry: Judgement and insight impaired. Mood & affect cannot be assessed.     The results of significant diagnostics from this hospitalization (including imaging, microbiology, ancillary and laboratory) are listed below for reference.     Microbiology: Recent Results (from the past 240 hour(s))  Resp panel by RT-PCR (RSV, Flu A&B, Covid) Anterior Nasal Swab     Status: None   Collection Time: 05/14/22  4:51 PM   Specimen: Anterior Nasal Swab  Result Value Ref Range Status   SARS Coronavirus 2 by RT PCR NEGATIVE NEGATIVE Final   Influenza A by PCR NEGATIVE NEGATIVE Final   Influenza B by PCR NEGATIVE NEGATIVE Final    Comment: (NOTE) The Xpert Xpress SARS-CoV-2/FLU/RSV plus assay is intended as an aid in the diagnosis of influenza from Nasopharyngeal swab specimens and should not be used as a sole basis for treatment. Nasal washings and aspirates are unacceptable for Xpert Xpress SARS-CoV-2/FLU/RSV testing.  Fact Sheet for Patients: EntrepreneurPulse.com.au  Fact Sheet for Healthcare Providers: IncredibleEmployment.be  This test is not yet approved or cleared by the [place] FDA and has been authorized for detection and/or diagnosis of SARS-CoV-2 by FDA under an Emergency Use Authorization (EUA). This EUA will remain in effect (meaning this test can be used) for the duration of the COVID-19 declaration under Section 564(b)(1) of the Act, 21 U.S.C. section 360bbb-3(b)(1), unless the authorization is terminated or revoked.     Resp Syncytial Virus by PCR NEGATIVE NEGATIVE Final    Comment: (NOTE) Fact Sheet for Patients: EntrepreneurPulse.com.au  Fact Sheet for Healthcare Providers: IncredibleEmployment.be  This test is not yet approved or cleared by the [place] FDA and has been authorized for detection  and/or diagnosis of SARS-CoV-2 by FDA under an Emergency Use Authorization (EUA). This EUA will remain in effect (meaning this test can be used) for the duration of the COVID-19 declaration under Section 564(b)(1) of  the Act, 21 U.S.C. section 360bbb-3(b)(1), unless the authorization is terminated or revoked.  Performed at [facility], [place] [address] [place], [place] [address]   Blood Culture (routine x 2)     Status: None (Preliminary result)   Collection Time: 05/14/22  4:58 PM   Specimen: BLOOD  Result Value Ref Range Status   Specimen Description BLOOD SITE NOT SPECIFIED  Final   Special Requests   Final    BOTTLES DRAWN AEROBIC AND ANAEROBIC Blood Culture adequate volume   Culture   Final    NO GROWTH 3 DAYS Performed at [facility], [address] [place], [place] [address]    Report Status PENDING  Incomplete  Blood Culture (routine x 2)     Status: None (Preliminary result)   Collection Time: 05/14/22  4:59 PM   Specimen: BLOOD  Result Value Ref Range Status   Specimen Description BLOOD SITE NOT SPECIFIED  Final   Special Requests   Final    BOTTLES DRAWN AEROBIC AND ANAEROBIC Blood Culture results may not be optimal due to an inadequate volume of blood received in culture bottles   Culture   Final    NO GROWTH 3 DAYS Performed at [facility], [place] [address], [place], [place] [address]    Report Status PENDING  Incomplete     Labs: CBC: Recent Labs  Lab 05/14/22 1700 05/14/22 2219 05/15/22 1435  WBC 11.8*  --  9.3  NEUTROABS 9.9*  --   --   HGB 12.4 11.6* 10.3*  HCT 38.8 34.0* 31.5*  MCV 99.0  --  98.4  PLT 269  --  123456    Basic Metabolic Panel: Recent Labs  Lab 05/14/22 1700 05/14/22 2207 05/14/22 2219 05/15/22 1435  NA 138  --  138 139  K 4.2  --  4.1 4.3  CL 101  --   --  104  CO2 24  --   --  23  GLUCOSE 205*  --   --  100*  BUN 28*  --   --  28*  CREATININE 1.07*  --   --  1.27*  CALCIUM 9.3  --   --  8.4*  MG  --   1.3*  --  1.5*  PHOS  --  3.1  --  3.8    Liver Function Tests: Recent Labs  Lab 05/14/22 1700 05/15/22 1435  AST 23 25  ALT 11 9  ALKPHOS 60 39  BILITOT 1.0 0.8  PROT 6.8 5.3*  ALBUMIN 3.5 2.5*    CBG: Recent Labs  Lab 05/15/22 0331 05/15/22 0755 05/15/22 1741 05/16/22 0023 05/16/22 0524  GLUCAP 139* 153* 86 134* 127*    Hgb A1c Recent Labs    05/15/22 0023  HGBA1C 6.1*    Thyroid function studies Recent Labs    05/14/22 1700  TSH 1.044    Urinalysis    Component Value Date/Time   COLORURINE YELLOW 05/14/2022 2107   APPEARANCEUR HAZY (A) 05/14/2022 2107   LABSPEC 1.009 05/14/2022 2107   PHURINE 7.0 05/14/2022 2107   GLUCOSEU >=500 (A) 05/14/2022 2107   HGBUR NEGATIVE 05/14/2022 2107   BILIRUBINUR NEGATIVE 05/14/2022 2107   KETONESUR 5 (A) 05/14/2022 2107   PROTEINUR NEGATIVE 05/14/2022 2107   NITRITE NEGATIVE 05/14/2022 2107   LEUKOCYTESUR NEGATIVE 05/14/2022 2107    Discussed with patient's son Beth Pennington at bedside, updated care and answered all questions.  Reviewed again choices including antibiotics and he  did not wish to restart any antibiotics at this time.  Time coordinating discharge: 35 minutes  SIGNED:  Vernell Leep, MD,  FACP, [place], [facility], [facility], [facility]   [facility] Physician Advisor [place]     To contact the attending provider between 7A-7P or the covering provider during after hours 7P-7A, please log into the web site [email or web address] and access using universal [place] password for that web site. If you do not have the password, please call the hospital operator.

## 2022-05-17 NOTE — TOC Transition Note (Addendum)
Transition of Care [facility]) - CM/SW Discharge Note   Patient Details  Name: Beth Pennington MRN: [id number] Date of Birth: [birth year]  Transition of Care [facility] CM/SW Contact:  Curlene Labrum, RN Phone Number: 05/17/2022, 11:37 AM   Clinical Narrative:    CM called and spoke with [name], [name] this morning a t 7:30 and hospital bed and oxygen have not arrived to [facility] facility yet.  [place], CM is following up with [facility] to check on delivery time today.  The ALF facility is requesting oxygen orders be adjusted to 2L/min [place].  DME orders changed and Dr. [name] to co-sign.  Discharge summary was faxed to [facility] this morning and I'm just waiting on delivery of hospital bed and oxygen to the facility before setting up PTAR.  Bedside nursing - please call [facility] to give nursing report at [phone].  05/17/2022 1530 - I called and left a message with [facility] to determine if hospital bed and oxygen were delivered to the facility.  05/17/2022 1630 - Hospital bed and oxygen were delivered to [facility].  PTAR was scheduled for transport.  Patient's son was updated.   Final next level of care: Home w Hospice Care Barriers to Discharge: Continued Medical Work up   Patient Goals and CMS Choice CMS Medicare.gov Compare Post Acute Care list provided to:: Other (Comment Required) [name] and [name] - son, daughter-in-law) Choice offered to / list presented to : Adult Children  Discharge Placement                         Discharge Plan and Services Additional resources added to the After Visit Summary for     Discharge Planning Services: CM Consult Post Acute Care Choice: Hospice, Durable Medical Equipment          DME Arranged: Hospital bed, Oxygen DME Agency: [facility] Date DME Agency Contacted: 05/16/22 Time DME Agency Contacted: [phone] Representative spoke with at DME Agency:  [name], CM to call [facility] to call to arrange delivery to the facility [facility] Arranged: RN [facility] Agency: [place] ([place]) Date [place]: 05/16/22 Time [place]: 1626 Representative spoke with at [place]: [name], [place] with [facility]  Social Determinants of Health (SDOH) Interventions SDOH Screenings   Tobacco Use: Medium Risk (05/14/2022)     Readmission Risk Interventions    05/16/2022    4:29 PM  Readmission Risk Prevention Plan  Transportation Screening Complete  PCP or Specialist Appt within 3-5 Days Complete  HRI or [place] Complete  Social Work Consult for [place] Planning/Counseling Complete  Palliative Care Screening Complete  Medication Review Press photographer) Complete

## 2022-05-19 LAB — CULTURE, BLOOD (ROUTINE X 2)
Culture: NO GROWTH
Culture: NO GROWTH
Special Requests: ADEQUATE

## 2022-05-29 DIAGNOSIS — I10 Essential (primary) hypertension: Secondary | ICD-10-CM | POA: Diagnosis not present

## 2022-05-29 DIAGNOSIS — M6281 Muscle weakness (generalized): Secondary | ICD-10-CM | POA: Diagnosis not present

## 2022-08-11 DEATH — deceased

## 2023-09-22 IMAGING — DX DG KNEE 1-2V PORT*L*
4 series · 4 of 4 positions shown · non-contrast
Comparison: Pelvis 08/15/2021

CLINICAL DATA: LEFT hip fracture

EXAM:
PORTABLE LEFT KNEE - 1-2 VIEW

[knee ap]
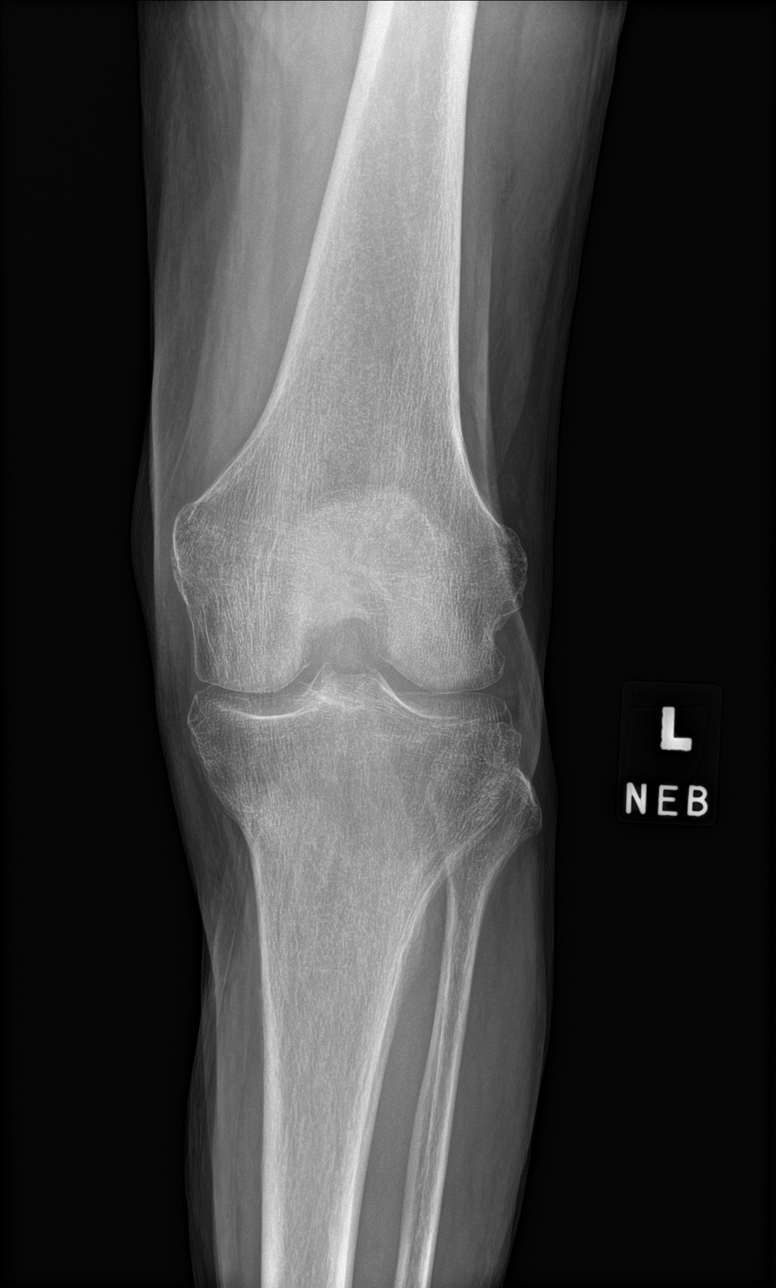

[knee lat]
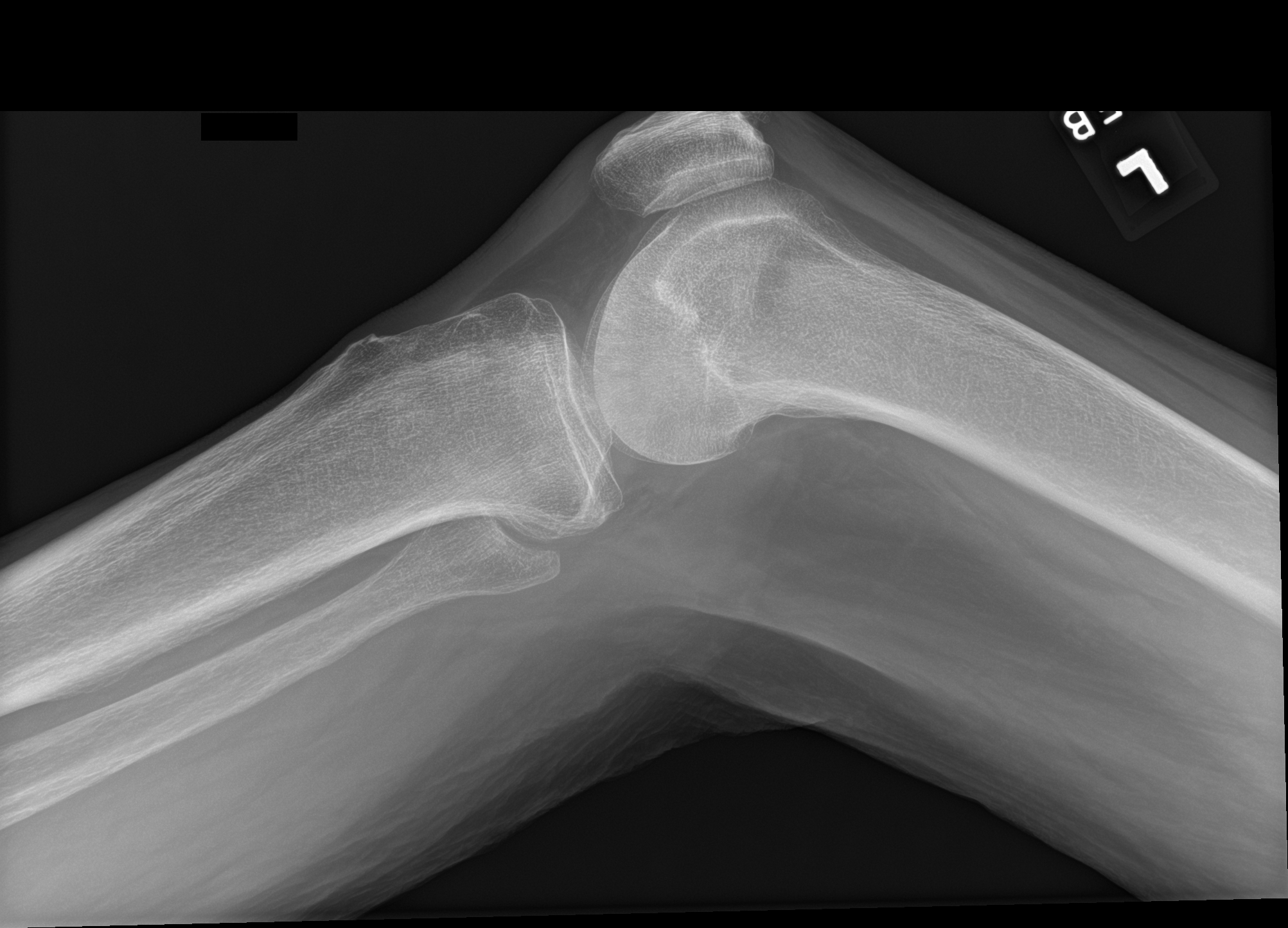

[knee obl (1 of 2)]
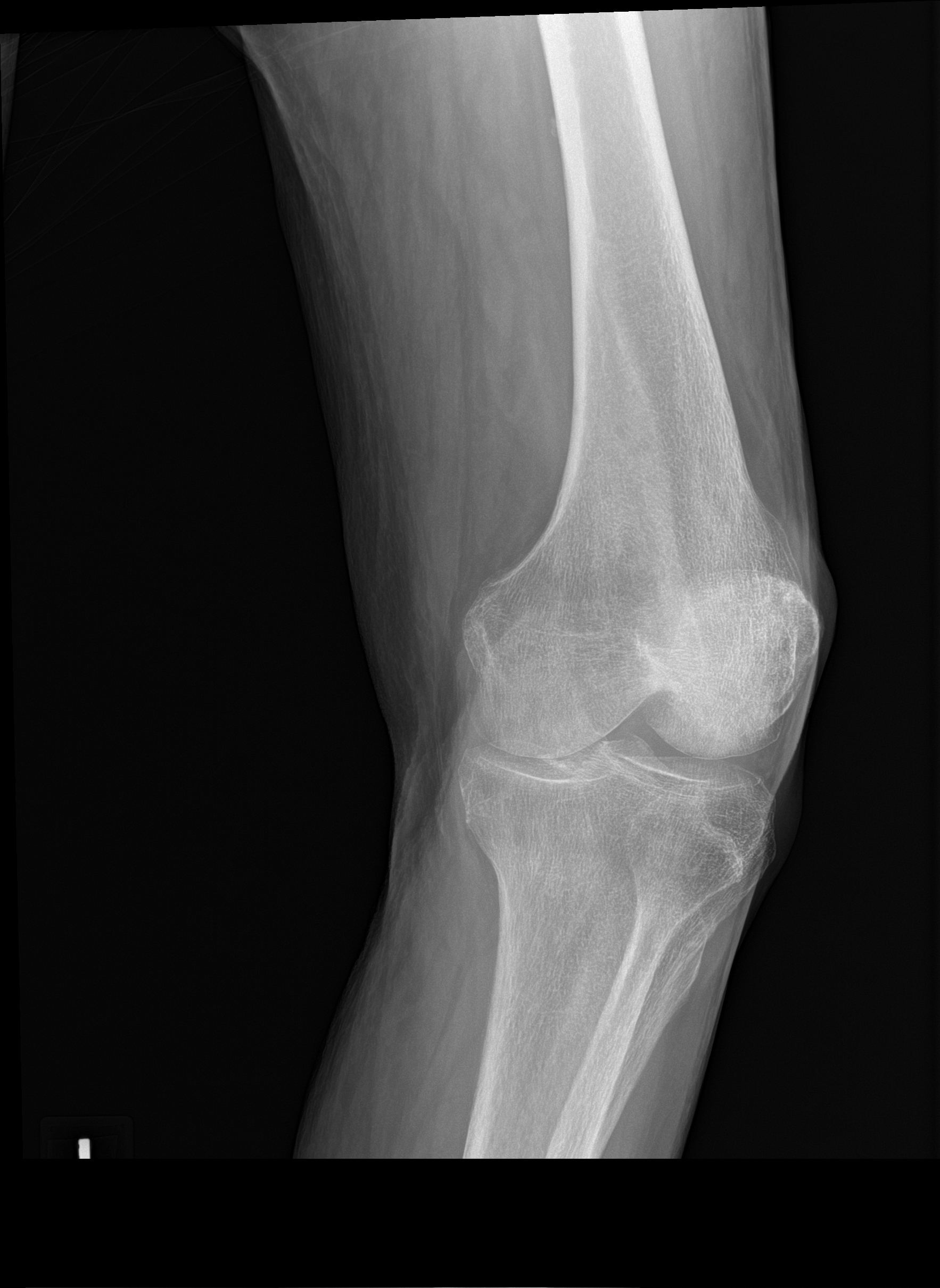

[knee obl (2 of 2)]
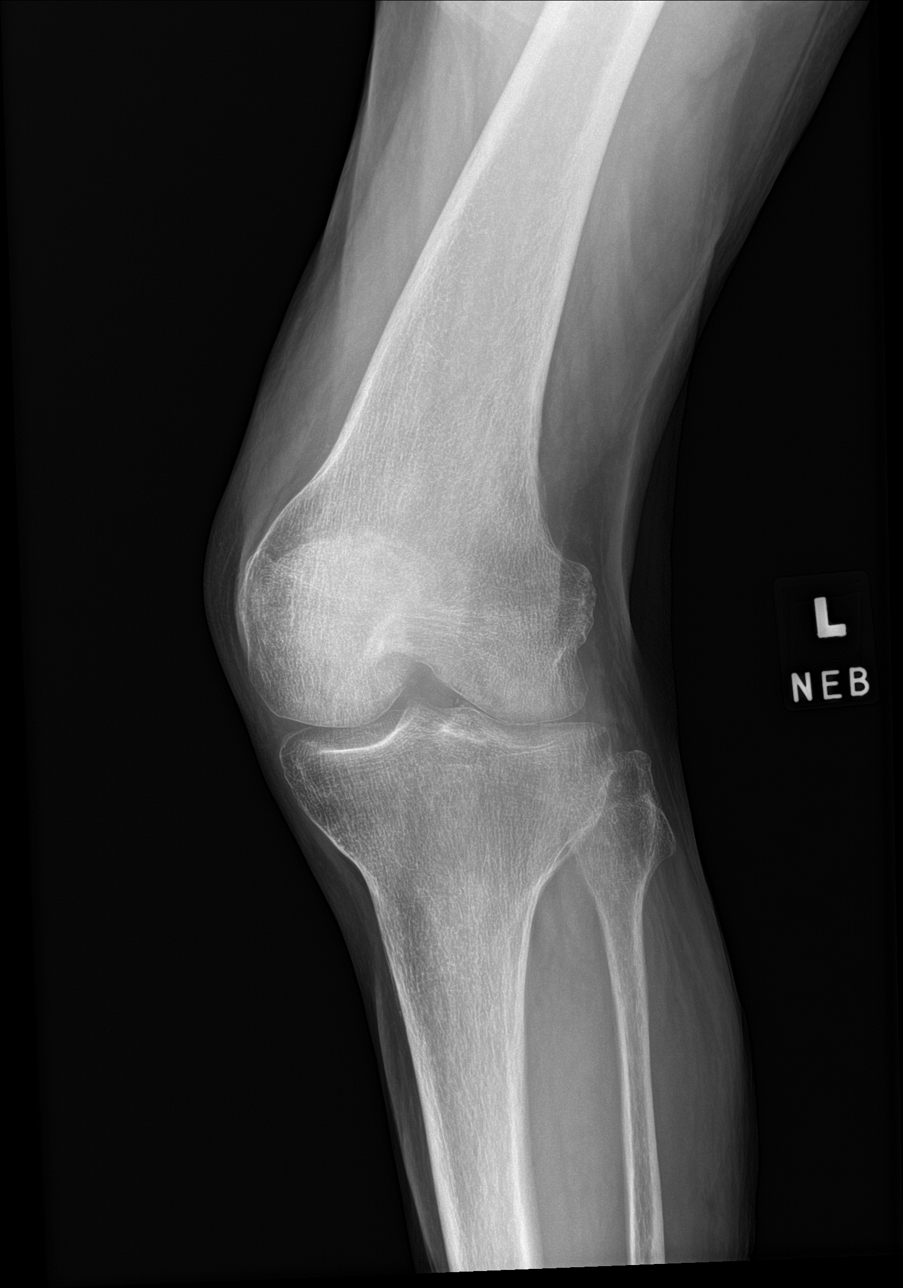

[4 of 4 positions shown; findings below may reference images not displayed]

FINDINGS: No fracture of the proximal tibia or distal femur. Patella is
normal. No joint effusion.
IMPRESSION: No fracture or dislocation.

## 2023-09-23 IMAGING — XA DG HIP (WITH OR WITHOUT PELVIS) 1V*L*
1 series · 2 of 2 positions shown · non-contrast
Comparison: 08/15/2021

CLINICAL DATA: Left total hip arthroplasty

EXAM:
DG HIP (WITH OR WITHOUT PELVIS) 1V*L*

[Series 1: unknown protocol · 2 of 2 slices shown]
[im 1/2]
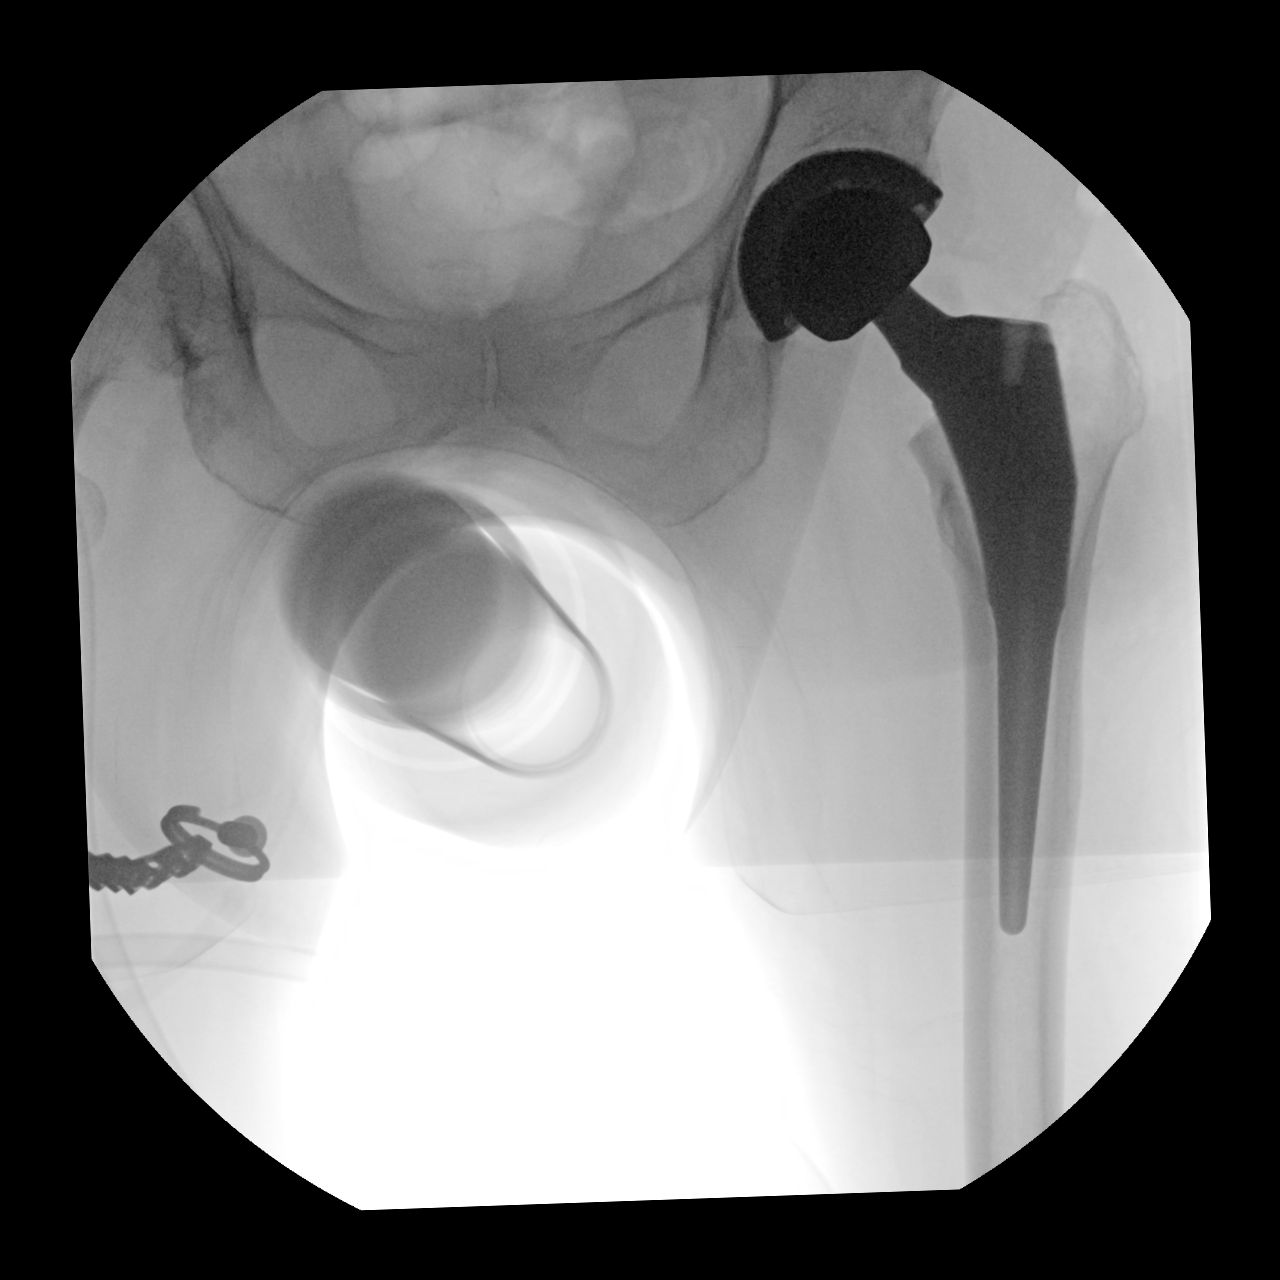
[im 2/2]
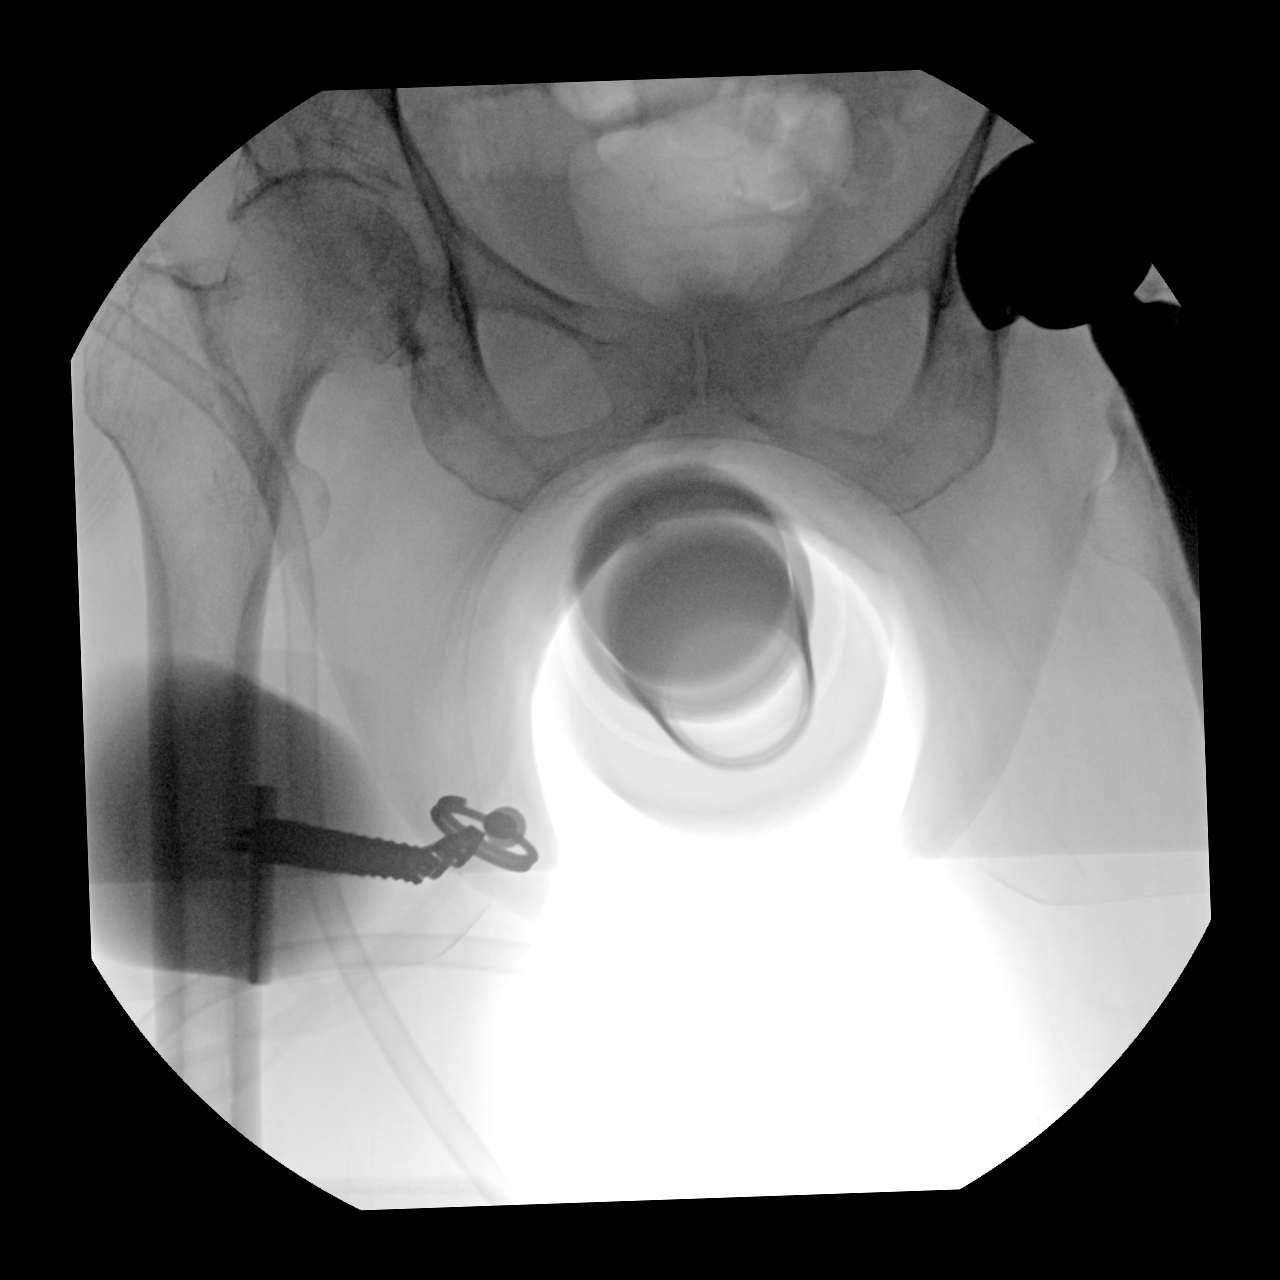

[2 of 2 positions shown; findings below may reference images not displayed]

FINDINGS: Two images show total hip arthroplasty on the left. Components
appear well positioned. No radiographically detectable complication.
IMPRESSION: Good appearance following total hip arthroplasty on the left.

## 2023-09-23 IMAGING — DX DG PORTABLE PELVIS
1 series · 1 of 1 positions shown · non-contrast
Comparison: 08/17/2021.

CLINICAL DATA: Closed displaced fracture of left femoral neck,
postop hip replacement.

EXAM:
PORTABLE PELVIS 1-2 VIEWS

[pelvis ap]
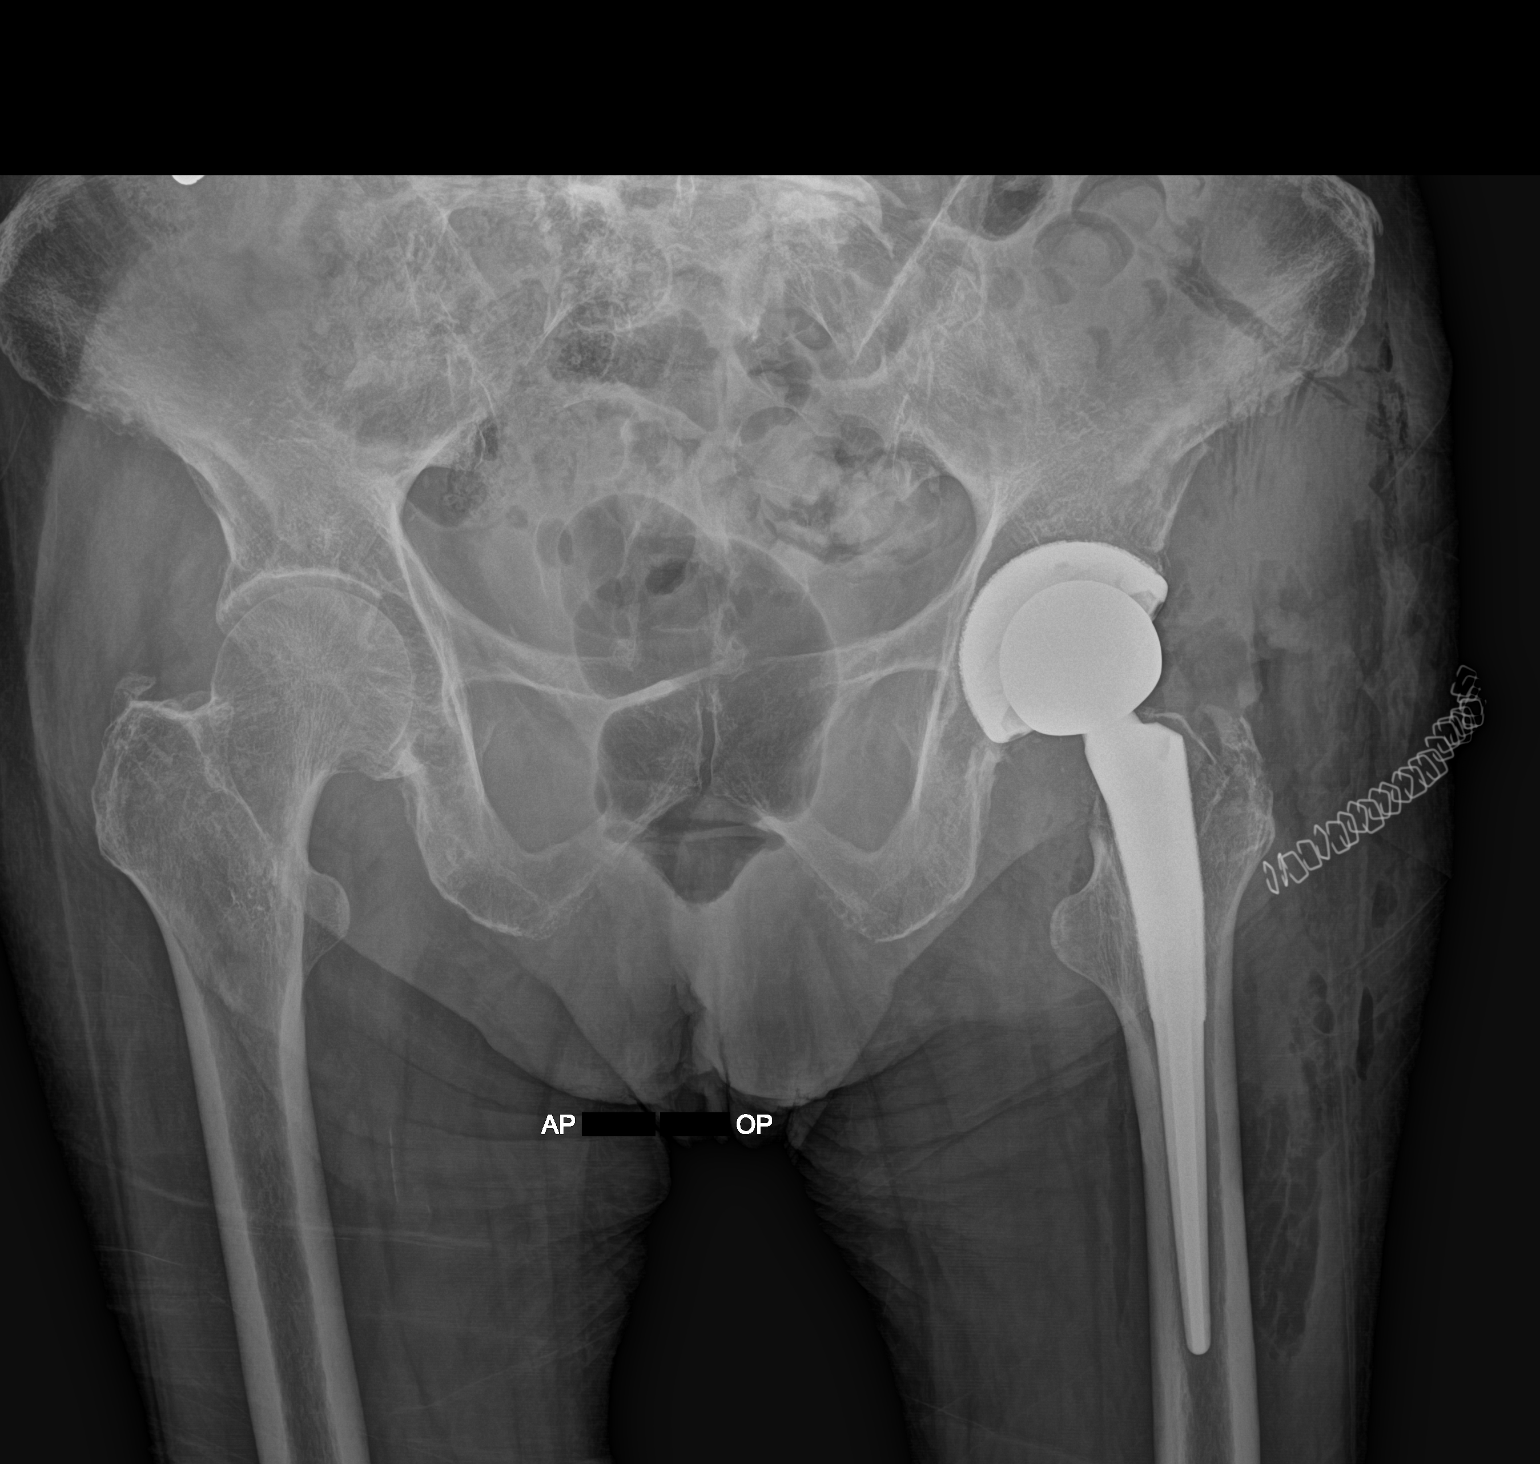

[1 of 1 positions shown; findings below may reference images not displayed]

FINDINGS: Total hip arthroplasty changes are noted on the left. Alignment is
within normal limits. There is bony deformity of the residual
femoral neck which may be due to recent fracture or postsurgical
changes. The remaining bony structures are intact. Degenerative
changes are noted in the lower lumbar spine. Surgical staples in air
are noted in the soft tissues of the left hip.
IMPRESSION: Status post total hip arthroplasty on the left. There is bony
deformity of the residual femoral neck which may be related to
recent fracture or postoperative changes.
